# Patient Record
Sex: Female | Born: 2000
Health system: Southern US, Community
[De-identification: ages and names within clinical notes are randomized; demographics above are authoritative.]

## PROBLEM LIST (undated history)

## (undated) ENCOUNTER — Emergency Department: Admission: EM | Payer: Medicaid Other | Source: Home / Self Care

## (undated) DIAGNOSIS — F909 Attention-deficit hyperactivity disorder, unspecified type: Secondary | ICD-10-CM

## (undated) DIAGNOSIS — F419 Anxiety disorder, unspecified: Secondary | ICD-10-CM

## (undated) DIAGNOSIS — S02609A Fracture of mandible, unspecified, initial encounter for closed fracture: Secondary | ICD-10-CM

## (undated) DIAGNOSIS — D649 Anemia, unspecified: Secondary | ICD-10-CM

## (undated) DIAGNOSIS — F32A Depression, unspecified: Secondary | ICD-10-CM

## (undated) DIAGNOSIS — F209 Schizophrenia, unspecified: Secondary | ICD-10-CM

## (undated) HISTORY — DX: Schizophrenia, unspecified: F20.9

## (undated) HISTORY — DX: Attention-deficit hyperactivity disorder, unspecified type: F90.9

## (undated) HISTORY — DX: Depression, unspecified: F32.A

## (undated) HISTORY — DX: Anxiety disorder, unspecified: F41.9

## (undated) HISTORY — PX: NO PAST SURGERIES: SHX2092

---
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
## 2019-08-01 ENCOUNTER — Emergency Department (HOSPITAL_COMMUNITY): Payer: No Typology Code available for payment source

## 2019-08-01 ENCOUNTER — Other Ambulatory Visit: Payer: Self-pay

## 2019-08-01 ENCOUNTER — Encounter (HOSPITAL_COMMUNITY): Payer: Self-pay | Admitting: Pharmacy Technician

## 2019-08-01 ENCOUNTER — Inpatient Hospital Stay (HOSPITAL_COMMUNITY)
Admission: EM | Admit: 2019-08-01 | Discharge: 2019-08-02 | DRG: 200 | Disposition: A | Discharge status: Home / Self Care | Payer: No Typology Code available for payment source | Attending: Physician Assistant | Admitting: Physician Assistant

## 2019-08-01 DIAGNOSIS — S0083XA Contusion of other part of head, initial encounter: Secondary | ICD-10-CM | POA: Diagnosis present

## 2019-08-01 DIAGNOSIS — F17210 Nicotine dependence, cigarettes, uncomplicated: Secondary | ICD-10-CM | POA: Diagnosis present

## 2019-08-01 DIAGNOSIS — R52 Pain, unspecified: Secondary | ICD-10-CM | POA: Diagnosis not present

## 2019-08-01 DIAGNOSIS — R000 Tachycardia, unspecified: Secondary | ICD-10-CM | POA: Diagnosis present

## 2019-08-01 DIAGNOSIS — M264 Malocclusion, unspecified: Secondary | ICD-10-CM | POA: Diagnosis present

## 2019-08-01 DIAGNOSIS — S270XXA Traumatic pneumothorax, initial encounter: Secondary | ICD-10-CM | POA: Diagnosis not present

## 2019-08-01 DIAGNOSIS — Z20828 Contact with and (suspected) exposure to other viral communicable diseases: Secondary | ICD-10-CM | POA: Diagnosis present

## 2019-08-01 DIAGNOSIS — S02609A Fracture of mandible, unspecified, initial encounter for closed fracture: Secondary | ICD-10-CM

## 2019-08-01 DIAGNOSIS — S02652A Fracture of angle of left mandible, initial encounter for closed fracture: Secondary | ICD-10-CM | POA: Diagnosis present

## 2019-08-01 DIAGNOSIS — S0081XA Abrasion of other part of head, initial encounter: Secondary | ICD-10-CM | POA: Diagnosis present

## 2019-08-01 DIAGNOSIS — J939 Pneumothorax, unspecified: Secondary | ICD-10-CM

## 2019-08-01 DIAGNOSIS — Y92410 Unspecified street and highway as the place of occurrence of the external cause: Secondary | ICD-10-CM

## 2019-08-01 LAB — COMPREHENSIVE METABOLIC PANEL
ALT: 125 U/L — ABNORMAL HIGH (ref 0–44)
AST: 192 U/L — ABNORMAL HIGH (ref 15–41)
Albumin: 3.9 g/dL (ref 3.5–5.0)
Alkaline Phosphatase: 81 U/L (ref 38–126)
Anion gap: 12 (ref 5–15)
BUN: <5 mg/dL — ABNORMAL LOW (ref 6–20)
CO2: 23 mmol/L (ref 22–32)
Calcium: 8.9 mg/dL (ref 8.9–10.3)
Chloride: 102 mmol/L (ref 98–111)
Creatinine, Ser: 0.57 mg/dL (ref 0.44–1.00)
GFR calc Af Amer: >60 mL/min (ref >60)
GFR calc non Af Amer: >60 mL/min (ref >60)
Glucose, Bld: 115 mg/dL — ABNORMAL HIGH (ref 70–99)
Potassium: 3.4 mmol/L — ABNORMAL LOW (ref 3.5–5.1)
Sodium: 137 mmol/L (ref 135–145)
Total Bilirubin: 0.4 mg/dL (ref 0.3–1.2)
Total Protein: 6.9 g/dL (ref 6.5–8.1)

## 2019-08-01 LAB — I-STAT BETA HCG BLOOD, ED (MC, WL, AP ONLY): I-stat hCG, quantitative: <5 m[IU]/mL (ref <5)

## 2019-08-01 LAB — I-STAT CHEM 8, ED
BUN: <3 mg/dL — ABNORMAL LOW (ref 6–20)
Calcium, Ion: 1.1 mmol/L — ABNORMAL LOW (ref 1.15–1.40)
Chloride: 100 mmol/L (ref 98–111)
Creatinine, Ser: 0.6 mg/dL (ref 0.44–1.00)
Glucose, Bld: 110 mg/dL — ABNORMAL HIGH (ref 70–99)
HCT: 40 % (ref 36.0–46.0)
Hemoglobin: 13.6 g/dL (ref 12.0–15.0)
Potassium: 3.5 mmol/L (ref 3.5–5.1)
Sodium: 140 mmol/L (ref 135–145)
TCO2: 26 mmol/L (ref 22–32)

## 2019-08-01 LAB — PROTIME-INR
INR: 1 (ref 0.8–1.2)
Prothrombin Time: 13.4 seconds (ref 11.4–15.2)

## 2019-08-01 LAB — SAMPLE TO BLOOD BANK

## 2019-08-01 LAB — SARS CORONAVIRUS 2 BY RT PCR (HOSPITAL ORDER, PERFORMED IN ~~LOC~~ HOSPITAL LAB): SARS Coronavirus 2: NEGATIVE

## 2019-08-01 LAB — CBC
HCT: 40.1 % (ref 36.0–46.0)
Hemoglobin: 13.2 g/dL (ref 12.0–15.0)
MCH: 30.9 pg (ref 26.0–34.0)
MCHC: 32.9 g/dL (ref 30.0–36.0)
MCV: 93.9 fL (ref 80.0–100.0)
Platelets: 367 10*3/uL (ref 150–400)
RBC: 4.27 MIL/uL (ref 3.87–5.11)
RDW: 11.7 % (ref 11.5–15.5)
WBC: 12 10*3/uL — ABNORMAL HIGH (ref 4.0–10.5)
nRBC: 0 % (ref 0.0–0.2)

## 2019-08-01 LAB — LACTIC ACID, PLASMA: Lactic Acid, Venous: 2 mmol/L (ref 0.5–1.9)

## 2019-08-01 LAB — ETHANOL: Alcohol, Ethyl (B): <10 mg/dL (ref <10)

## 2019-08-01 MED ORDER — IOHEXOL 350 MG/ML SOLN
100.0000 mL | Freq: Once | INTRAVENOUS | Status: AC | PRN
  Administered 2019-08-01: 100 mL via INTRAVENOUS

## 2019-08-01 MED ORDER — TETANUS-DIPHTH-ACELL PERTUSSIS 5-2.5-18.5 LF-MCG/0.5 IM SUSP
0.5000 mL | Freq: Once | INTRAMUSCULAR | Status: AC
  Administered 2019-08-01: 0.5 mL via INTRAMUSCULAR
  Filled 2019-08-01: qty 0.5

## 2019-08-01 MED ORDER — FENTANYL CITRATE (PF) 100 MCG/2ML IJ SOLN
50.0000 ug | Freq: Once | INTRAMUSCULAR | Status: AC
  Administered 2019-08-01: 50 ug via INTRAVENOUS
  Filled 2019-08-01: qty 2

## 2019-08-01 MED ORDER — LACTATED RINGERS IV BOLUS
1000.0000 mL | Freq: Once | INTRAVENOUS | Status: AC
  Administered 2019-08-02: 1000 mL via INTRAVENOUS

## 2019-08-01 NOTE — ED Notes (Signed)
When pt gets a chance her fiances aunt, Harlow Mares, would like her to call [phone]
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
## 2019-08-01 NOTE — ED Notes (Signed)
Pt to CT
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
## 2019-08-01 NOTE — ED Notes (Signed)
Multiple facial piercings removed and placed in denture cup prior to going to CT

## 2019-08-02 ENCOUNTER — Encounter (HOSPITAL_COMMUNITY): Payer: Self-pay | Admitting: Emergency Medicine

## 2019-08-02 ENCOUNTER — Inpatient Hospital Stay (HOSPITAL_COMMUNITY): Payer: No Typology Code available for payment source

## 2019-08-02 DIAGNOSIS — S0081XA Abrasion of other part of head, initial encounter: Secondary | ICD-10-CM | POA: Diagnosis present

## 2019-08-02 DIAGNOSIS — M264 Malocclusion, unspecified: Secondary | ICD-10-CM | POA: Diagnosis present

## 2019-08-02 DIAGNOSIS — S270XXA Traumatic pneumothorax, initial encounter: Secondary | ICD-10-CM | POA: Diagnosis present

## 2019-08-02 DIAGNOSIS — Z20828 Contact with and (suspected) exposure to other viral communicable diseases: Secondary | ICD-10-CM | POA: Diagnosis present

## 2019-08-02 DIAGNOSIS — Y92410 Unspecified street and highway as the place of occurrence of the external cause: Secondary | ICD-10-CM | POA: Diagnosis not present

## 2019-08-02 DIAGNOSIS — S0083XA Contusion of other part of head, initial encounter: Secondary | ICD-10-CM | POA: Diagnosis present

## 2019-08-02 DIAGNOSIS — S02652A Fracture of angle of left mandible, initial encounter for closed fracture: Secondary | ICD-10-CM | POA: Diagnosis present

## 2019-08-02 DIAGNOSIS — R52 Pain, unspecified: Secondary | ICD-10-CM | POA: Diagnosis present

## 2019-08-02 DIAGNOSIS — F17210 Nicotine dependence, cigarettes, uncomplicated: Secondary | ICD-10-CM | POA: Diagnosis present

## 2019-08-02 DIAGNOSIS — R000 Tachycardia, unspecified: Secondary | ICD-10-CM | POA: Diagnosis present

## 2019-08-02 LAB — CBC
HCT: 37.1 % (ref 36.0–46.0)
Hemoglobin: 12.6 g/dL (ref 12.0–15.0)
MCH: 31.7 pg (ref 26.0–34.0)
MCHC: 34 g/dL (ref 30.0–36.0)
MCV: 93.2 fL (ref 80.0–100.0)
Platelets: 267 10*3/uL (ref 150–400)
RBC: 3.98 MIL/uL (ref 3.87–5.11)
RDW: 11.7 % (ref 11.5–15.5)
WBC: 11.3 10*3/uL — ABNORMAL HIGH (ref 4.0–10.5)
nRBC: 0 % (ref 0.0–0.2)

## 2019-08-02 LAB — BASIC METABOLIC PANEL
Anion gap: 11 (ref 5–15)
BUN: 5 mg/dL — ABNORMAL LOW (ref 6–20)
CO2: 25 mmol/L (ref 22–32)
Calcium: 8.8 mg/dL — ABNORMAL LOW (ref 8.9–10.3)
Chloride: 103 mmol/L (ref 98–111)
Creatinine, Ser: 0.67 mg/dL (ref 0.44–1.00)
GFR calc Af Amer: >60 mL/min (ref >60)
GFR calc non Af Amer: >60 mL/min (ref >60)
Glucose, Bld: 103 mg/dL — ABNORMAL HIGH (ref 70–99)
Potassium: 4 mmol/L (ref 3.5–5.1)
Sodium: 139 mmol/L (ref 135–145)

## 2019-08-02 LAB — URINALYSIS, ROUTINE W REFLEX MICROSCOPIC
Bacteria, UA: NONE SEEN
Bilirubin Urine: NEGATIVE
Glucose, UA: NEGATIVE mg/dL
Ketones, ur: NEGATIVE mg/dL
Leukocytes,Ua: NEGATIVE
Nitrite: NEGATIVE
Protein, ur: NEGATIVE mg/dL
Specific Gravity, Urine: 1.033 — ABNORMAL HIGH (ref 1.005–1.030)
pH: 7 (ref 5.0–8.0)

## 2019-08-02 LAB — CDS SEROLOGY

## 2019-08-02 LAB — HIV ANTIBODY (ROUTINE TESTING W REFLEX): HIV Screen 4th Generation wRfx: NONREACTIVE

## 2019-08-02 MED ORDER — ACETAMINOPHEN 325 MG PO TABS: 650.0000 mg | ORAL_TABLET | Freq: Four times a day (QID) | ORAL | Status: DC | PRN

## 2019-08-02 MED ORDER — OXYCODONE HCL 5 MG PO TABS: 5.0000 mg | ORAL_TABLET | Freq: Four times a day (QID) | ORAL | 0 refills | Status: DC | PRN

## 2019-08-02 MED ORDER — ONDANSETRON 4 MG PO TBDP: 4.0000 mg | ORAL_TABLET | Freq: Four times a day (QID) | ORAL | 0 refills | Status: DC | PRN

## 2019-08-02 MED ORDER — OXYCODONE HCL 5 MG PO TABS
5.0000 mg | ORAL_TABLET | ORAL | Status: DC | PRN
  Administered 2019-08-02 (×2): 5 mg via ORAL
  Filled 2019-08-02 (×2): qty 1

## 2019-08-02 MED ORDER — ONDANSETRON 4 MG PO TBDP: 4.0000 mg | ORAL_TABLET | Freq: Four times a day (QID) | ORAL | Status: DC | PRN

## 2019-08-02 MED ORDER — DEXTROSE-NACL 5-0.9 % IV SOLN
INTRAVENOUS | Status: DC
  Administered 2019-08-02: 09:00:00 via INTRAVENOUS

## 2019-08-02 MED ORDER — ENOXAPARIN SODIUM 40 MG/0.4ML ~~LOC~~ SOLN: 40.0000 mg | Freq: Every day | SUBCUTANEOUS | Status: DC

## 2019-08-02 MED ORDER — HYDROMORPHONE HCL 1 MG/ML IJ SOLN: 0.5000 mg | INTRAMUSCULAR | Status: DC | PRN

## 2019-08-02 MED ORDER — HYDROMORPHONE HCL 1 MG/ML IJ SOLN
1.0000 mg | INTRAMUSCULAR | Status: DC | PRN
  Administered 2019-08-02: 1 mg via INTRAVENOUS
  Filled 2019-08-02: qty 1

## 2019-08-02 MED ORDER — ONDANSETRON HCL 4 MG/2ML IJ SOLN
4.0000 mg | Freq: Four times a day (QID) | INTRAMUSCULAR | Status: DC | PRN
  Administered 2019-08-02: 4 mg via INTRAVENOUS
  Filled 2019-08-02: qty 2

## 2019-08-02 MED FILL — oxyCODONE HCL 5 MG TABS: 5 | 2 days supply | Qty: 15 | Fill #0

## 2019-08-02 MED FILL — ONDANSETRON ODT 4 MG TABLET: 4 | 4 days supply | Qty: 15 | Fill #0

## 2019-08-02 NOTE — Progress Notes (Signed)
Rn went over discharge instructions with the patient and she stated understanding. IV has been removed pt ride on the way.precriptions dropped off by [facility] pharmacy

## 2019-08-02 NOTE — Discharge Instructions (Signed)
Full Liquid Diet A full liquid diet refers to fluids and foods that are liquid or will become liquid at room temperature. This diet should only be used for a short period of time to help you recover from illness or surgery. Your health care provider or dietitian will help you determine when it is safe to eat regular foods. What are tips for following this plan?     Reading food labels  Check food labels of nutrition shakes for the amount of protein. Look for nutrition shakes that have at least 8-10 grams of protein in each serving.  Look for drinks, such as milks and juices, that are "fortified" or "enriched." This means that vitamins and minerals have been added. Shopping  Buy premade nutritional shakes to keep on hand.  To vary your choices, buy different flavors of milks and shakes. Meal planning  Choose flavors and foods that you enjoy.  To make sure you get enough energy from food (calories): ? Eat 3 full liquid meals each day. Have a liquid snack between each meal. ? Drink 6-8 ounces (177-237 ml) of a nutrition supplement shake with meals or as snacks. ? Add protein powder, powdered milk, milk, or yogurt to shakes to increase the amount of protein.  Drink at least one serving a day of citrus fruit juice or fruit juice that has vitamin C added. General guidelines  Before starting the full-liquid diet, check with your health care provider to know what foods you should avoid. These may include full-fat or high-fiber liquids.  You may have any liquid or food that becomes a liquid at room temperature. The food is considered a liquid if it can be poured off a spoon at room temperature.  Do not drink alcohol unless approved by your health care provider.  This diet gives you most of the nutrients that you need for energy, but you may not get enough of certain vitamins, minerals, and fiber. Make sure to talk to your health care provider or dietitian about: ? How many calories you need  to eat get day. ? How much fluid you should have each day. ? Taking a multivitamin or a nutritional supplement. What foods are allowed? The items listed may not be a complete list. Talk with your dietitian about what dietary choices are best for you. Grains Thin hot cereal, such as cream of wheat. Soft-cooked pasta or rice pured in soup. Vegetables Pulp-free tomato or vegetable juice. Vegetables pured in soup. Fruits Fruit juice without pulp. Strained fruit pures (seeds and skins removed). Meats and other protein foods Beef, chicken, and fish broths. Powdered protein supplements. Dairy Milk and milk-based beverages, including milk shakes and instant breakfast mixes. Smooth yogurt. Pured cottage cheese. Beverages Water. Coffee and tea (caffeinated or decaffeinated). Cocoa. Liquid nutritional supplements. Soft drinks. Nondairy milks, such as almond, coconut, rice, or soy milk. Fats and oils Melted margarine and butter. Cream. Canola, almond, avocado, corn, grapeseed, sunflower, and sesame oils. Gravy. Sweets and desserts Custard. Pudding. Flavored gelatin. Smooth ice cream (without nuts or candy pieces). Sherbet. Popsicles. [place] ice. Pudding pops. Seasoning and other foods Salt and pepper. Spices. Cocoa powder. Vinegar. Ketchup. Yellow mustard. Smooth sauces, such as Hollandaise, cheese sauce, or white sauce. Soy sauce. Cream soups. Strained soups. Syrup. Honey. Jelly (without fruit pieces). What foods are not allowed? The items listed may not be a complete list. Talk with your dietitian about what dietary choices are best for you. Grains Whole grains. Pasta. Rice. Cold cereal. Bread. Crackers.  Vegetables All whole fresh, frozen, or canned vegetables. Fruits All whole fresh, frozen, or canned fruits. Meats and other protein foods All cuts of meat, poultry, and fish. Eggs. Tofu and soy protein. Nuts and nut butters. Lunch meat. Sausage. Dairy Hard cheese. Yogurt with fruit  chunks. Fats and oils Coconut oil. Palm oil. Lard. Cold butter. Sweets and desserts Ice cream or other frozen desserts that have any solids in them or on top, such as nuts, chocolate chips, and pieces of cookies. Cakes. Cookies. Candy. Seasoning and other foods Stone-ground mustards. Soups with chunks or pieces. Summary  A full liquid diet refers to fluids and foods that are liquid or will become liquid at room temperature.  This diet should only be used for a short period of time to help you recover from illness or surgery. Ask your health care provider or dietitian when it is safe for you to eat regular foods.  To make sure you get enough calories and nutrients, eat 3 meals each day with snacks between. Drink premade nutrition supplement shakes or add protein powder to homemade shakes. Take a vitamin and mineral supplement as told by your health care provider. This information is not intended to replace advice given to you by your health care provider. Make sure you discuss any questions you have with your health care provider. Document Released: 09/23/2005 Document Revised: 12/20/2017 Document Reviewed: 11/06/2016 Elsevier Patient Education  2020 [employer]   Pneumothorax A pneumothorax is commonly called a collapsed lung. It is a condition in which air leaks from a lung and builds up between the thin layer of tissue that covers the lungs (visceral pleura) and the interior wall of the chest cavity (parietal pleura). The air gets trapped outside the lung, between the lung and the chest wall (pleural space). The air takes up space and prevents the lung from fully expanding. This condition sometimes occurs suddenly with no apparent cause. The buildup of air may be small or large. A small pneumothorax may go away on its own. A large pneumothorax will require treatment and hospitalization. What are the causes? This condition may be caused by:  Trauma and injury to the chest  wall.  Surgery and other medical procedures.  A complication of an underlying lung problem, especially chronic obstructive pulmonary disease (COPD) or emphysema. Sometimes the cause of this condition is not known. What increases the risk? You are more likely to develop this condition if:  You have an underlying lung problem.  You smoke.  You are 7820-20 years old, female, tall, and underweight.  You have a personal or family history of pneumothorax.  You have an eating disorder (anorexia nervosa). This condition can also happen quickly, even in people with no history of lung problems. What are the signs or symptoms? Sometimes a pneumothorax will have no symptoms. When symptoms are present, they can include:  Chest pain.  Shortness of breath.  Increased rate of breathing.  Bluish color to your lips or skin (cyanosis). How is this diagnosed? This condition may be diagnosed by:  A medical history and physical exam.  A chest X-ray, chest CT scan, or ultrasound. How is this treated? Treatment depends on how severe your condition is. The goal of treatment is to remove the extra air and allow your lung to expand back to its normal size.  For a small pneumothorax: ? No treatment may be needed. ? Extra oxygen is sometimes used to make it go away more quickly.  For a large  pneumothorax or a pneumothorax that is causing symptoms, a procedure is done to drain the air from your lungs. To do this, a health care provider may use: ? A needle with a syringe. This is used to suck air from a pleural space where no additional leakage is taking place. ? A chest tube. This is used to suck air where there is ongoing leakage into the pleural space. The chest tube may need to remain in place for several days until the air leak has healed.  In more severe cases, surgery may be needed to repair the damage that is causing the leak.  If you have multiple pneumothorax episodes or have an air leak that  will not heal, a procedure called a pleurodesis may be done. A medicine is placed in the pleural space to irritate the tissues around the lung so that the lung will stick to the chest wall, seal any leaks, and stop any buildup of air in that space. If you have an underlying lung problem, severe symptoms, or a large pneumothorax you will usually need to stay in the hospital. Follow these instructions at home: Lifestyle  Do not use any products that contain nicotine or tobacco, such as cigarettes and e-cigarettes. These are major risk factors in pneumothorax. If you need help quitting, ask your health care provider.  Do not lift anything that is heavier than 10 lb (4.5 kg), or the limit that your health care provider tells you, until he or she says that it is safe.  Avoid activities that take a lot of effort (strenuous) for as long as told by your health care provider.  Return to your normal activities as told by your health care provider. Ask your health care provider what activities are safe for you.  Do not fly in an airplane or scuba dive until your health care provider says it is okay. General instructions  Take over-the-counter and prescription medicines only as told by your health care provider.  If a cough or pain makes it difficult for you to sleep at night, try sleeping in a semi-upright position in a recliner or by using 2 or 3 pillows.  If you had a chest tube and it was removed, ask your health care provider when you can remove the bandage (dressing). While the dressing is in place, do not allow it to get wet.  Keep all follow-up visits as told by your health care provider. This is important. Contact a health care provider if:  You cough up thick mucus (sputum) that is yellow or green in color.  You were treated with a chest tube, and you have redness, increasing pain, or discharge at the site where it was placed. Get help right away if:  You have increasing chest pain or  shortness of breath.  You have a cough that will not go away.  You begin coughing up blood.  You have pain that is getting worse or is not controlled with medicines.  The site where your chest tube was located opens up.  You feel air coming out of the site where the chest tube was placed.  You have a fever or persistent symptoms for more than 2-3 days.  You have a fever and your symptoms suddenly get worse. These symptoms may represent a serious problem that is an emergency. Do not wait to see if the symptoms will go away. Get medical help right away. Call your local emergency services (911 in the [place] Do not drive  yourself to the hospital. Summary  A pneumothorax, commonly called a collapsed lung, is a condition in which air leaks from a lung and gets trapped between the lung and the chest wall (pleural space).  The buildup of air may be small or large. A small pneumothorax may go away on its own. A large pneumothorax will require treatment and hospitalization.  Treatment for this condition depends on how severe the pneumothorax is. The goal of treatment is to remove the extra air and allow the lung to expand back to its normal size. This information is not intended to replace advice given to you by your health care provider. Make sure you discuss any questions you have with your health care provider. Document Released: 09/23/2005 Document Revised: 09/05/2017 Document Reviewed: 09/01/2017 Elsevier Patient Education  2020 [employer].
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
## 2019-08-02 NOTE — ED Notes (Signed)
Report given to [facility].

## 2019-08-02 NOTE — Consult Note (Signed)
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 optic nerve sheath complexes. Normal caliber of the superior ophthalmic veins. Sinuses: Paranasal sinuses and mastoid air cells are predominantly clear. Middle ear cavities and external auditory canals are clear. Ossicular chains are normal configuration. Soft tissues: Much of the swelling and radiopaque debris is seen within the left frontal and supraorbital soft tissues. There is an additional 5 mm linear radiodensity seen within the soft tissues superficial to the left frontal process of the zygoma. Soft tissue swelling and thickening adjacent to the left mandibular fracture. CT CERVICAL SPINE FINDINGS Alignment: Preservation of the normal cervical lordosis without traumatic listhesis. No abnormal facet widening. Normal alignment of the craniocervical and atlantoaxial articulations. Skull base and vertebrae: No acute fracture. No primary bone lesion or focal pathologic process. Soft tissues and spinal canal: No pre or paravertebral fluid or swelling. No visible canal hematoma.  Disc levels: No significant central canal or foraminal stenosis identified within the imaged levels of the spine. Upper chest: Left apical pneumothorax. No acute abnormality of the right apex. Other: None IMPRESSION: Swelling, laceration and debris within the left frontal  scalp and supraorbital soft tissues. Additional linear radiodensity in the left malar the soft tissues anterior to the frontal process of the left zygoma. Nondisplaced oblique fracture through the angle of the left mandible extending through the socket of the left second mandibular molar and traversing the mandibular canal. Correlate with clinical assessment of the left mental nerve. No calvarial fracture or acute intracranial abnormality. No cervical spine fracture or traumatic listhesis. Small left apical pneumothorax. Extensive periodontal disease, correlate with dental exam. Electronically Signed   By: Kreg Shropshire M.D.   On: 08/01/2019 22:37    AVW:[id number] except as listed in admit H&P  Blood pressure 102/80, pulse 91, temperature 98.2 F (36.8 C), temperature source Oral, resp. rate 17, height  (1.6 m), weight 54.4 kg, SpO2 99 %.  PHYSICAL EXAM: Overall appearance:  Healthy appearing, in no distress, speaking and breathing normally. Head:  Normocephalic, atraumatic. Ears: External ears look healthy. Nose: External nose is healthy in appearance. Internal nasal exam free of any lesions or obstruction. Oral Cavity/Pharynx:  There are no mucosal lesions or masses identified.  Dentition in very poor shape.  Multiple missing teeth and multiple cavities and decay present.  Difficult to assess occlusion based on the shape of the dentition.  She is tender along the left and mandibular angle.  There is mild swelling in the area.  There is no obvious crepitance or step-off of the mandible itself. Larynx/Hypopharynx: Deferred Neuro:  No identifiable neurologic deficits. Neck: No palpable neck masses.  Studies Reviewed:  Maxillofacial CT reviewed.  Procedures: none   Assessment/Plan: Nondisplaced left mandibular angle fracture.  Given the symptomatic malocclusion and pain recommend maxillomandibular fixation.  I will schedule her in the next day or 2.  In the meantime she can stay on a liquid diet.  Serena Colonel 08/02/2019, 7:33 AM

## 2019-08-03 ENCOUNTER — Encounter (HOSPITAL_COMMUNITY): Payer: Self-pay | Admitting: Emergency Medicine

## 2019-08-03 NOTE — Progress Notes (Signed)
Attempted to call pt for pre-op call. The number listed for pt, the person who answered it stated that they do not know her. 2 other numbers listed for possible family/friend were not working. Father's number went to voicemail. I left pre-op instructions on his phone. Instructed him to have pt arrive at 7:30 for a 10:30 surgery just in case she needs a Covid test done. She had one on the 25th, but not sure if pt has quarantined since being discharged yesterday.   I spoke with Dr. Smith Robert, Anesthesiologist to make him aware of pt's elevated AST and ALT. He stated that should not be an issue.

## 2019-08-04 ENCOUNTER — Encounter (HOSPITAL_COMMUNITY): Payer: Self-pay | Admitting: Certified Registered"

## 2019-08-04 ENCOUNTER — Inpatient Hospital Stay (HOSPITAL_COMMUNITY): Admission: RE | Admit: 2019-08-04 | Payer: Self-pay | Source: Home / Self Care | Admitting: Otolaryngology

## 2019-08-04 SURGERY — CLOSED REDUCTION, MANDIBLE, WITH ARCH BAR APPLICATION AND INTERMAXILLARY FIXATION
Anesthesia: General

## 2019-08-04 NOTE — Progress Notes (Signed)
Attempted to reach patient again but unsuccessful.  Left message for Dr. Constance Holster and Anderson Malta, his assistant.  Notified OR desk, OR room, charge CRNA, and anesthesiologist that we have been unable to reach patient at this time.

## 2019-08-10 ENCOUNTER — Other Ambulatory Visit: Payer: Self-pay

## 2019-08-10 ENCOUNTER — Encounter (HOSPITAL_BASED_OUTPATIENT_CLINIC_OR_DEPARTMENT_OTHER): Payer: Self-pay | Admitting: *Deleted

## 2019-08-11 ENCOUNTER — Ambulatory Visit (HOSPITAL_BASED_OUTPATIENT_CLINIC_OR_DEPARTMENT_OTHER): Admission: RE | Admit: 2019-08-11 | Payer: Self-pay | Source: Home / Self Care | Admitting: Otolaryngology

## 2019-08-11 ENCOUNTER — Encounter (HOSPITAL_COMMUNITY): Payer: Self-pay | Admitting: Certified Registered"

## 2019-08-11 HISTORY — DX: Fracture of mandible, unspecified, initial encounter for closed fracture: S02.609A

## 2019-08-11 HISTORY — DX: Anemia, unspecified: D64.9

## 2019-08-11 SURGERY — CLOSED REDUCTION, MANDIBLE, WITH ARCH BAR APPLICATION AND INTERMAXILLARY FIXATION
Anesthesia: General | Laterality: Left

## 2019-08-11 MED ORDER — LIDOCAINE 2% (20 MG/ML) 5 ML SYRINGE
INTRAMUSCULAR | Status: AC
  Filled 2019-08-11: qty 5

## 2019-08-11 MED ORDER — ROCURONIUM BROMIDE 10 MG/ML (PF) SYRINGE
PREFILLED_SYRINGE | INTRAVENOUS | Status: AC
  Filled 2019-08-11: qty 10

## 2019-08-11 MED ORDER — PROPOFOL 10 MG/ML IV BOLUS
INTRAVENOUS | Status: AC
  Filled 2019-08-11: qty 20

## 2020-02-12 ENCOUNTER — Emergency Department
Admission: EM | Admit: 2020-02-12 | Discharge: 2020-02-13 | Disposition: A | Discharge status: Other Acute Inpatient Hospital | Payer: Medicaid Other | Attending: Emergency Medicine | Admitting: Emergency Medicine

## 2020-02-12 ENCOUNTER — Other Ambulatory Visit: Payer: Self-pay

## 2020-02-12 DIAGNOSIS — Z20822 Contact with and (suspected) exposure to covid-19: Secondary | ICD-10-CM | POA: Diagnosis not present

## 2020-02-12 DIAGNOSIS — Z79899 Other long term (current) drug therapy: Secondary | ICD-10-CM | POA: Insufficient documentation

## 2020-02-12 DIAGNOSIS — F17200 Nicotine dependence, unspecified, uncomplicated: Secondary | ICD-10-CM | POA: Insufficient documentation

## 2020-02-12 DIAGNOSIS — R443 Hallucinations, unspecified: Secondary | ICD-10-CM

## 2020-02-12 DIAGNOSIS — F209 Schizophrenia, unspecified: Secondary | ICD-10-CM | POA: Diagnosis not present

## 2020-02-12 LAB — RESPIRATORY PANEL BY RT PCR (FLU A&B, COVID)
Influenza A by PCR: NEGATIVE
Influenza B by PCR: NEGATIVE
SARS Coronavirus 2 by RT PCR: NEGATIVE

## 2020-02-12 LAB — COMPREHENSIVE METABOLIC PANEL
ALT: 16 U/L (ref 0–44)
AST: 22 U/L (ref 15–41)
Albumin: 4.5 g/dL (ref 3.5–5.0)
Alkaline Phosphatase: 87 U/L (ref 38–126)
Anion gap: 10 (ref 5–15)
BUN: 10 mg/dL (ref 6–20)
CO2: 26 mmol/L (ref 22–32)
Calcium: 9.4 mg/dL (ref 8.9–10.3)
Chloride: 104 mmol/L (ref 98–111)
Creatinine, Ser: 0.52 mg/dL (ref 0.44–1.00)
GFR calc Af Amer: >60 mL/min (ref >60)
GFR calc non Af Amer: >60 mL/min (ref >60)
Glucose, Bld: 93 mg/dL (ref 70–99)
Potassium: 4.5 mmol/L (ref 3.5–5.1)
Sodium: 140 mmol/L (ref 135–145)
Total Bilirubin: 0.5 mg/dL (ref 0.3–1.2)
Total Protein: 8.3 g/dL — ABNORMAL HIGH (ref 6.5–8.1)

## 2020-02-12 LAB — CBC
HCT: 39.3 % (ref 36.0–46.0)
Hemoglobin: 13.2 g/dL (ref 12.0–15.0)
MCH: 31.5 pg (ref 26.0–34.0)
MCHC: 33.6 g/dL (ref 30.0–36.0)
MCV: 93.8 fL (ref 80.0–100.0)
Platelets: 379 10*3/uL (ref 150–400)
RBC: 4.19 MIL/uL (ref 3.87–5.11)
RDW: 12 % (ref 11.5–15.5)
WBC: 10 10*3/uL (ref 4.0–10.5)
nRBC: 0 % (ref 0.0–0.2)

## 2020-02-12 LAB — ETHANOL: Alcohol, Ethyl (B): <10 mg/dL (ref <10)

## 2020-02-12 LAB — SALICYLATE LEVEL: Salicylate Lvl: <7 mg/dL — ABNORMAL LOW (ref 7.0–30.0)

## 2020-02-12 LAB — ACETAMINOPHEN LEVEL: Acetaminophen (Tylenol), Serum: <10 ug/mL — ABNORMAL LOW (ref 10–30)

## 2020-02-12 NOTE — ED Notes (Signed)
Pt dressed out The following items placed in one of one labeled bag: grey metal barbell navel ring, grey metal barbell earring, two black circular earrings, two grey plug earrings, two grey barbell nipple rings, one grey metal ring, one grey metal ring with purple stone, two black velcro bracelets, black bracelet with shell charms, black elastic hairband, rainbow dog collar, white and black sneakers, black socks, white sweatshirt, black t shirt, pink tank top, jeans, black bra, black panties, grey mask.
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
## 2020-02-12 NOTE — ED Triage Notes (Signed)
Pt states is seeing things and hearing things. Pt states history of schizophrenia. Pt appears in no acute distress in triage, denies SI HI.

## 2020-02-12 NOTE — ED Notes (Signed)
Pt. Transferred to BHU from ED to room 4 after screening for contraband. Report to include Situation, Background, Assessment and Recommendations from [facility]. Pt. Oriented to unit including Q15 minute rounds as well as the security cameras for their protection. Patient is alert and oriented, warm and dry in no acute distress. Patient denies SI, HI, but states that she hears "voices" without command and sees "bugs and people".   Pt. Encouraged to let me know if needs arise.

## 2020-02-12 NOTE — BH Assessment (Signed)
Assessment Note  Carol Flowers is an 19 y.o. female. Carol Flowers arrived to the ED by way of personal transportation by her mother.  She reports, "I have had schizophrenia since age 21 and I have tried multiple medications and I did not have to go to therapy anymore, I did not have the money to.  The last couple of days my schizophrenic episodes have been getting worse.   For the past years, I have been pretty good at telling the reality from the hallucinations.  Lately I have not been able to and I am seeing new things.  Now I can't tell the reality from the hallucination because I am seeing things that I see every day.  I am seeing my family members and I cannot tell if they are really there or if they are not."  She denied symptoms of depression.  She reports an increase in anxiety and reports, "bad social anxiety".  She reports an increase in her OCD symptoms.  She denied suicidal and homicidal ideation and intent.  She reports that she has been facing stress from trying to find a job and taking care of her family, "I take the weight of all my family's problems usually".  She denied the use of alcohol or drugs.  She reports a history of panic attacks.  She reports feeling responsible to take care of her family. She multiple family stressors.   Diagnosis: Schizophrenia  Past Medical History:  Past Medical History:  Diagnosis Date  . Anemia   . Mandible fracture [facility])     Past Surgical History:  Procedure Laterality Date  . NO PAST SURGERIES      Family History: No family history on file.  Social History:  reports that she has been smoking. She has been smoking about 0.25 packs per day. She has never used smokeless tobacco. She reports that she does not drink alcohol or use drugs.  Additional Social History:  Alcohol / Drug Use History of alcohol / drug use?: (No drug use for at least 3 years.)  CIWA: CIWA-Ar BP: 127/88 Pulse Rate: (!) 118 COWS:    Allergies: No Known  Allergies  Home Medications: (Not in a hospital admission)   OB/GYN Status:  No LMP recorded (lmp unknown). (Menstrual status: IUD).  General Assessment Data Location of Assessment: [facility] ED TTS Assessment: In system Is this a Tele or Face-to-Face Assessment?: Face-to-Face Is this an Initial Assessment or a Re-assessment for this encounter?: Initial Assessment Patient Accompanied by:: N/A Language Other than English: No Living Arrangements: Other (Comment)(Private residence) What gender do you identify as?: Female Marital status: Married Pregnancy Status: No Living Arrangements: Spouse/significant other Can pt return to current living arrangement?: Yes Admission Status: Voluntary Is patient capable of signing voluntary admission?: Yes Referral Source: Self/Family/Friend Insurance type: None  Medical Screening Exam [facility] Walk-in ONLY) Medical Exam completed: Yes  Crisis Care Plan Living Arrangements: Spouse/significant other Legal Guardian: Other:(Self) Name of Psychiatrist: None Name of Therapist: None  Education Status Is patient currently in school?: No Is the patient employed, unemployed or receiving disability?: Unemployed  Risk to self with the past 6 months Suicidal Ideation: No Has patient been a risk to self within the past 6 months prior to admission? : No Suicidal Intent: No Has patient had any suicidal intent within the past 6 months prior to admission? : No Is patient at risk for suicide?: No Suicidal Plan?: No Has patient had any suicidal plan within the past 6 months  prior to admission? : No Access to Means: No What has been your use of drugs/alcohol within the last 12 months?: Denied use for over 3 years Previous Attempts/Gestures: Yes How many times?: (Unsure -  attempts occurred around age 1) Other Self Harm Risks: Denied Triggers for Past Attempts: Unknown Intentional Self Injurious Behavior: None Family Suicide History: No Recent stressful life  event(s): Other (Comment), Financial Problems(Family stressors) Persecutory voices/beliefs?: No Depression: No Substance abuse history and/or treatment for substance abuse?: Yes Suicide prevention information given to non-admitted patients: Not applicable  Risk to Others within the past 6 months Homicidal Ideation: No Does patient have any lifetime risk of violence toward others beyond the six months prior to admission? : No Thoughts of Harm to Others: No Current Homicidal Intent: No Current Homicidal Plan: No Access to Homicidal Means: No Identified Victim: None identified History of harm to others?: No Assessment of Violence: On admission Does patient have access to weapons?: No Criminal Charges Pending?: No Does patient have a court date: No Is patient on probation?: No  Psychosis Hallucinations: Auditory, Visual Delusions: None noted  Mental Status Report Appearance/Hygiene: In scrubs Eye Contact: Fair Motor Activity: Unremarkable Speech: Logical/coherent Level of Consciousness: Alert Mood: Euthymic Affect: Appropriate to circumstance Anxiety Level: Minimal Thought Processes: Coherent Judgement: Unimpaired Orientation: Appropriate for developmental age Obsessive Compulsive Thoughts/Behaviors: None  Cognitive Functioning Concentration: Decreased Memory: Recent Intact Is patient IDD: No Insight: Fair Impulse Control: Good Appetite: Fair Have you had any weight changes? : No Change Sleep: Decreased(Poor sleep) Vegetative Symptoms: None  ADLScreening [facility]) Patient's cognitive ability adequate to safely complete daily activities?: Yes Patient able to express need for assistance with ADLs?: Yes Independently performs ADLs?: Yes (appropriate for developmental age)  Prior Inpatient Therapy Prior Inpatient Therapy: Yes Prior Therapy Dates: 2016 Prior Therapy Facilty/Provider(s): [facility] Reason for Treatment: Schizophrenia  Prior Outpatient  Therapy Prior Outpatient Therapy: Yes Prior Therapy Dates: 2016  Prior Therapy Facilty/Provider(s): [facility] Reason for Treatment: Schizophrenia Does patient have an ACCT team?: No Does patient have Intensive In-House Services?  : No Does patient have [facility] services? : No Does patient have P4CC services?: No  ADL Screening (condition at time of admission) Patient's cognitive ability adequate to safely complete daily activities?: Yes Is the patient deaf or have difficulty hearing?: No Does the patient have difficulty seeing, even when wearing glasses/contacts?: No Does the patient have difficulty concentrating, remembering, or making decisions?: No Patient able to express need for assistance with ADLs?: Yes Does the patient have difficulty dressing or bathing?: No Independently performs ADLs?: Yes (appropriate for developmental age) Does the patient have difficulty walking or climbing stairs?: No Weakness of Legs: None Weakness of Arms/Hands: None  Home Assistive Devices/Equipment Home Assistive Devices/Equipment: None    Abuse/Neglect Assessment (Assessment to be complete while patient is alone) Abuse/Neglect Assessment Can Be Completed: (Denied a history of abuse)                Disposition:  Disposition Initial Assessment Completed for this Encounter: Yes  On Site Evaluation by:   Reviewed with Physician:    Elmer Bales 02/12/2020 10:46 PM
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
## 2020-02-13 ENCOUNTER — Encounter: Payer: Self-pay | Admitting: Psychiatric/Mental Health

## 2020-02-13 ENCOUNTER — Inpatient Hospital Stay
Admission: RE | Admit: 2020-02-13 | Discharge: 2020-02-15 | DRG: 885 | Disposition: A | Discharge status: Home / Self Care | Payer: Medicaid Other | Source: Intra-hospital | Attending: Psychiatry | Admitting: Psychiatry

## 2020-02-13 DIAGNOSIS — F17210 Nicotine dependence, cigarettes, uncomplicated: Secondary | ICD-10-CM | POA: Diagnosis present

## 2020-02-13 DIAGNOSIS — F609 Personality disorder, unspecified: Secondary | ICD-10-CM | POA: Diagnosis present

## 2020-02-13 DIAGNOSIS — Z79899 Other long term (current) drug therapy: Secondary | ICD-10-CM | POA: Diagnosis not present

## 2020-02-13 DIAGNOSIS — F29 Unspecified psychosis not due to a substance or known physiological condition: Secondary | ICD-10-CM | POA: Diagnosis not present

## 2020-02-13 DIAGNOSIS — F329 Major depressive disorder, single episode, unspecified: Secondary | ICD-10-CM | POA: Diagnosis present

## 2020-02-13 DIAGNOSIS — F4310 Post-traumatic stress disorder, unspecified: Secondary | ICD-10-CM | POA: Diagnosis present

## 2020-02-13 DIAGNOSIS — Z915 Personal history of self-harm: Secondary | ICD-10-CM | POA: Diagnosis not present

## 2020-02-13 DIAGNOSIS — Z20822 Contact with and (suspected) exposure to covid-19: Secondary | ICD-10-CM | POA: Diagnosis present

## 2020-02-13 DIAGNOSIS — F064 Anxiety disorder due to known physiological condition: Secondary | ICD-10-CM | POA: Diagnosis present

## 2020-02-13 DIAGNOSIS — Z811 Family history of alcohol abuse and dependence: Secondary | ICD-10-CM | POA: Diagnosis not present

## 2020-02-13 DIAGNOSIS — F1290 Cannabis use, unspecified, uncomplicated: Secondary | ICD-10-CM | POA: Diagnosis present

## 2020-02-13 DIAGNOSIS — R45851 Suicidal ideations: Secondary | ICD-10-CM | POA: Diagnosis present

## 2020-02-13 DIAGNOSIS — F209 Schizophrenia, unspecified: Principal | ICD-10-CM | POA: Diagnosis present

## 2020-02-13 DIAGNOSIS — Z7289 Other problems related to lifestyle: Secondary | ICD-10-CM | POA: Diagnosis not present

## 2020-02-13 DIAGNOSIS — D649 Anemia, unspecified: Secondary | ICD-10-CM | POA: Diagnosis present

## 2020-02-13 DIAGNOSIS — Z888 Allergy status to other drugs, medicaments and biological substances status: Secondary | ICD-10-CM

## 2020-02-13 LAB — URINE DRUG SCREEN, QUALITATIVE (ARMC ONLY)
Amphetamines, Ur Screen: NOT DETECTED
Barbiturates, Ur Screen: NOT DETECTED
Benzodiazepine, Ur Scrn: POSITIVE — AB
Cannabinoid 50 Ng, Ur ~~LOC~~: NOT DETECTED
Cocaine Metabolite,Ur ~~LOC~~: NOT DETECTED
MDMA (Ecstasy)Ur Screen: NOT DETECTED
Methadone Scn, Ur: NOT DETECTED
Opiate, Ur Screen: NOT DETECTED
Phencyclidine (PCP) Ur S: NOT DETECTED
Tricyclic, Ur Screen: POSITIVE — AB

## 2020-02-13 LAB — POCT PREGNANCY, URINE: Preg Test, Ur: NEGATIVE

## 2020-02-13 MED ORDER — NICOTINE 14 MG/24HR TD PT24
14.0000 mg | MEDICATED_PATCH | Freq: Every day | TRANSDERMAL | Status: DC
  Administered 2020-02-13 – 2020-02-15 (×3): 14 mg via TRANSDERMAL
  Filled 2020-02-13 (×4): qty 1

## 2020-02-13 MED ORDER — ALUM & MAG HYDROXIDE-SIMETH 200-200-20 MG/5ML PO SUSP: 30.0000 mL | ORAL | Status: DC | PRN

## 2020-02-13 MED ORDER — MAGNESIUM HYDROXIDE 400 MG/5ML PO SUSP: 30.0000 mL | Freq: Every day | ORAL | Status: DC | PRN

## 2020-02-13 MED ORDER — TRAZODONE HCL 50 MG PO TABS
50.0000 mg | ORAL_TABLET | Freq: Every evening | ORAL | Status: DC | PRN
  Administered 2020-02-13 – 2020-02-14 (×2): 50 mg via ORAL
  Filled 2020-02-13 (×2): qty 1

## 2020-02-13 MED ORDER — ACETAMINOPHEN 325 MG PO TABS
650.0000 mg | ORAL_TABLET | Freq: Four times a day (QID) | ORAL | Status: DC | PRN
  Administered 2020-02-13 – 2020-02-15 (×4): 650 mg via ORAL
  Filled 2020-02-13 (×4): qty 2

## 2020-02-13 MED ORDER — PALIPERIDONE PALMITATE ER 234 MG/1.5ML IM SUSY
234.0000 mg | PREFILLED_SYRINGE | Freq: Once | INTRAMUSCULAR | Status: AC
  Administered 2020-02-13: 234 mg via INTRAMUSCULAR
  Filled 2020-02-13: qty 1.5

## 2020-02-13 NOTE — ED Notes (Signed)
Hourly rounding reveals patient sleeping in room. No complaints, stable, in no acute distress. Q15 minute rounds and monitoring via Security Cameras to continue. 
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
## 2020-02-13 NOTE — Progress Notes (Signed)
Admission Note:  The patient arrived to the unit, voluntarily and accompanied by security, requesting help with managing auditory and visual hallucinations.  A two RN skin search was conducted, revealing numerous scars from a history of self-injurious behaviors (no episodes within the past two years).  Documents were reviewed, belongings were searched, and a brief tour of the unit was provided.  Carol Flowers was calm, cooperative, and pleasant.  She endorsed a history of schizophrenia, social anxiety, alcohol abuse, and marijuana use.  Substance abuse within the past year was denied.  The patient does not take prescribed medication or receive outpatient therapy.  Vital signs and behaviors upon arrival were unremarkable.  15-minute safety checks initiated.

## 2020-02-13 NOTE — ED Notes (Signed)
Pt given meal tray and water 
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
## 2020-02-13 NOTE — ED Notes (Signed)
Patient has been accepted to [facility].  Patient assigned to room 310 Accepting physician is Haskell Flirt, NP.  Call report to [phone].  Representative was [place].   ER Staff is aware of it:  [facility] ER Secretary  Dr. Colon Branch, ER MD  Jillyn Hidden Patient's Nurse     ``````````````````````````````````````````````````````````````````````````````````````~.

## 2020-02-13 NOTE — Tx Team (Signed)
Initial Treatment Plan 02/13/2020 12:43 PM [name] HSJ:[id number]    PATIENT STRESSORS: Other: "Seeing things and hearing voices."   PATIENT STRENGTHS: Ability for insight Capable of independent living Communication skills   PATIENT IDENTIFIED PROBLEMS: Auditory and visual hallucinations.                     DISCHARGE CRITERIA:  Improved stabilization in mood, thinking, and/or behavior  PRELIMINARY DISCHARGE PLAN: Outpatient therapy  PATIENT/FAMILY INVOLVEMENT: This treatment plan has been presented to and reviewed with the patient, Carol Flowers, and/or family member.  The patient and family have been given the opportunity to ask questions and make suggestions.  Virgina Organ, RN 02/13/2020, 12:43 PM

## 2020-02-13 NOTE — BHH Group Notes (Signed)
BHH LCSW Group Therapy Note  02/13/2020    Type of Therapy and Topic:  Group Therapy:  A Hero Worthy of Support  Participation Level:  Active   Description of Group:  Patients in this group were introduced to the concept that additional supports including self-support are an essential part of recovery.  Matching needs with supports to help fulfill those needs was explained.  Establishing boundaries that can gradually be increased or decreased was described, with patients giving their own examples of establishing appropriate boundaries in their lives.  A song entitled "My Own Hero" was played and a group discussion ensued in which patients stated it inspired them to help themselves in order to succeed, because other people cannot achieve their goals such as sobriety or stability for them.  A song was played called "I Am Enough" which led to a discussion about being willing to believe we are worth the effort of being a self-support.   Therapeutic Goals: 1)  demonstrate the importance of being a key part of one's own support system 2)  discuss various available supports 3)  encourage patient to use music as part of their self-support and focus on goals 4)  elicit ideas from patients about supports that need to be added   Summary of Patient Progress:  The patient expressed that she feels this hospital stay will be beneficial for her because she needs to start on medicine due to her mind no longer being able to tell the difference between hallucinations and reality.  She participated when called upon directly but only listened otherwise.  There was an incident with another patient who was clearly psychotic and disruptive to the group.  Other patients, but not this particular patient expressed that they were becoming quite anxious as a result.  Several calming exercises were used, including hand breathing and a 5-4-3-2-1 grounding exercise.  The patient listened but did not actively participate in either  exercise.    Therapeutic Modalities:   Motivational Interviewing Activity  Lynnell Chad

## 2020-02-13 NOTE — ED Provider Notes (Signed)
Emergency Medicine Observation Re-evaluation Note  Carol Flowers is a 19 y.o. female, seen on rounds today.  Pt initially presented to the ED for complaints of Hallucinations Currently, the patient is resting.  Physical Exam  BP 127/88   Pulse (!) 118   Temp 98.2 F (36.8 C) (Oral)   Resp 16   Ht 5\' 2"  (1.575 m)   Wt 50.8 kg   LMP  (LMP Unknown)   SpO2 100%   BMI 20.49 kg/m    ED Course / MDM  I have reviewed the labs performed to date as well as medications administered while in observation.  Recent changes in the last 24 hours include - none. Plan  Current plan is for admission at [facility] IP psych. Patient is not under full IVC at this time.   [facility] MD 02/13/20 [id number]

## 2020-02-14 DIAGNOSIS — F29 Unspecified psychosis not due to a substance or known physiological condition: Secondary | ICD-10-CM

## 2020-02-14 LAB — TSH: TSH: 0.557 u[IU]/mL (ref 0.350–4.500)

## 2020-02-14 MED ORDER — HYDROXYZINE HCL 25 MG PO TABS
25.0000 mg | ORAL_TABLET | Freq: Three times a day (TID) | ORAL | Status: DC | PRN
  Administered 2020-02-15: 25 mg via ORAL
  Filled 2020-02-14: qty 1

## 2020-02-14 NOTE — Plan of Care (Signed)
Patient new to the unit tonight.   Problem: Education: Goal: Emotional status will improve Outcome: Not Progressing Goal: Mental status will improve Outcome: Not Progressing

## 2020-02-14 NOTE — Plan of Care (Signed)
Patient stated she was feeling better today, denies SI/HI/AVH, anxiety and pain  Problem: Education: Goal: Emotional status will improve Outcome: Progressing Goal: Mental status will improve Outcome: Progressing

## 2020-02-14 NOTE — Progress Notes (Signed)
Patient was on the phone upon arrival to the unit. Patient pleasant during assessment denying SI/HI/AVH. Patient endorses pain in her teeth, see MAR. Patient endorses anxiety and depression rating them 7/10. Patient given education, support and encouragement to be active in her treatment. Patient observed interacting appropriately with staff and peers on the unit by this Clinical research associate. Patient being monitored Q 15 minutes for safety per unit protocol. Pt remains safe on the unit.

## 2020-02-14 NOTE — Progress Notes (Signed)
Recreation Therapy Notes  Date: 02/14/2020   Time: 9:30 am  Location: Craft room   Behavioral response: Appropriate   Intervention Topic: Self-care    Discussion/Intervention:  Group content today was focused on Self-Care. The group defined self-care and some positive ways they care for themselves. Individuals expressed ways and reasons why they neglected any self-care in the past. Patients described ways to improve self-care in the future. The group explained what could happen if they did not do any self-care activities at all. The group participated in the intervention "self-care assessment" where they had a chance to discover some of their weaknesses and strengths in self- care. Patient came up with a self-care plan to improve themselves in the future.  Clinical Observations/Feedback:  Patient came to group and stated that she cares for herself by writing and drawing. She explained that she has neglected self-care in the past by putting other first. Participant expressed that self care is important when you are mentally and emotionally stressed.  Individual was social with peers and staff while participating in the intervention.   Ryelynn Guedea LRT/CTRS        Khadija Thier 02/14/2020 12:28 PM
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
## 2020-02-14 NOTE — Progress Notes (Signed)
Patient was on the phone upon arrival to the unit. Patient pleasant during assessment denying SI/HI/AVH. Patient endorses pain in her teeth, see MAR. Patient denies anxiety and depression stating, "I am feeling a lot better today and hope I can get out tomorrow." Patient given education, support and encouragement to be active in her treatment. Patient observed interacting appropriately with staff and peers on the unit by this Clinical research associate. Patient being monitored Q 15 minutes for safety per unit protocol. Pt remains safe on the unit. Patient observed interacting appropriately on the unit with staff and peers.

## 2020-02-14 NOTE — Plan of Care (Signed)
D- Patient alert and oriented. Patient presents in a pleasant mood on assessment stating that she slept "about as good as I can with insomnia". Patient had complaints of tooth pain, in which she requested stated that "my family has bad teeth". When asked about depression and anxiety, patient stated "surprisingly no" and that she has "occassional" anxiety, "that's usual for me though". Patient denies SI, HI, AVH at this time, reporting "not too much, they've been slowly calming down", regarding the voices. Patient had no stated goals for today.   A- Scheduled medications administered to patient, per MD orders. Support and encouragement provided.  Routine safety checks conducted every 15 minutes.  Patient informed to notify staff with problems or concerns.  R- No adverse drug reactions noted. Patient contracts for safety at this time. Patient compliant with medications and treatment plan. Patient receptive, calm, and cooperative. Patient interacts well with others on the unit.  Patient remains safe at this time.  Problem: Education: Goal: Knowledge of [facility] Education information/materials will improve Outcome: Progressing Goal: Emotional status will improve Outcome: Progressing Goal: Mental status will improve Outcome: Progressing Goal: Verbalization of understanding the information provided will improve Outcome: Progressing   Problem: Activity: Goal: Interest or engagement in activities will improve Outcome: Progressing Goal: Sleeping patterns will improve Outcome: Progressing   Problem: Coping: Goal: Ability to verbalize frustrations and anger appropriately will improve Outcome: Progressing Goal: Ability to demonstrate self-control will improve Outcome: Progressing   Problem: Health Behavior/Discharge Planning: Goal: Identification of resources available to assist in meeting health care needs will improve Outcome: Progressing Goal: Compliance with treatment plan for  underlying cause of condition will improve Outcome: Progressing   Problem: Physical Regulation: Goal: Ability to maintain clinical measurements within normal limits will improve Outcome: Progressing   Problem: Safety: Goal: Periods of time without injury will increase Outcome: Progressing   Problem: Activity: Goal: Will verbalize the importance of balancing activity with adequate rest periods Outcome: Progressing   Problem: Education: Goal: Will be free of psychotic symptoms Outcome: Progressing Goal: Knowledge of the prescribed therapeutic regimen will improve Outcome: Progressing   Problem: Coping: Goal: Coping ability will improve Outcome: Progressing Goal: Will verbalize feelings Outcome: Progressing   Problem: Health Behavior/Discharge Planning: Goal: Compliance with prescribed medication regimen will improve Outcome: Progressing   Problem: Nutritional: Goal: Ability to achieve adequate nutritional intake will improve Outcome: Progressing   Problem: Role Relationship: Goal: Ability to communicate needs accurately will improve Outcome: Progressing Goal: Ability to interact with others will improve Outcome: Progressing   Problem: Safety: Goal: Ability to redirect hostility and anger into socially appropriate behaviors will improve Outcome: Progressing Goal: Ability to remain free from injury will improve Outcome: Progressing   Problem: Self-Care: Goal: Ability to participate in self-care as condition permits will improve Outcome: Progressing   Problem: Self-Concept: Goal: Will verbalize positive feelings about self Outcome: Progressing

## 2020-02-14 NOTE — [facility] Group Notes (Signed)
LCSW Group Therapy Note   02/14/2020 3:10 PM  Type of Therapy and Topic:  Group Therapy:  Overcoming Obstacles   Participation Level:  Did Not Attend   Description of Group:    In this group patients will be encouraged to explore what they see as obstacles to their own wellness and recovery. They will be guided to discuss their thoughts, feelings, and behaviors related to these obstacles. The group will process together ways to cope with barriers, with attention given to specific choices patients can make. Each patient will be challenged to identify changes they are motivated to make in order to overcome their obstacles. This group will be process-oriented, with patients participating in exploration of their own experiences as well as giving and receiving support and challenge from other group members.   Therapeutic Goals: 1. Patient will identify personal and current obstacles as they relate to admission. 2. Patient will identify barriers that currently interfere with their wellness or overcoming obstacles.  3. Patient will identify feelings, thought process and behaviors related to these barriers. 4. Patient will identify two changes they are willing to make to overcome these obstacles:      Summary of Patient Progress X    Therapeutic Modalities:   Cognitive Behavioral Therapy Solution Focused Therapy Motivational Interviewing Relapse Prevention Therapy  Haiden Clucas, MSW, LCSW 02/14/2020 3:10 PM    

## 2020-02-14 NOTE — Tx Team (Addendum)
Interdisciplinary Treatment and Diagnostic Plan Update  02/14/2020 Time of Session: 9:00AM Carol Flowers MRN: [id number]  Principal Diagnosis: <principal problem not specified>  Secondary Diagnoses: Active Problems:   Schizophrenia ([place])   Current Medications:  Current Facility-Administered Medications  Medication Dose Route Frequency Provider Last Rate Last Admin  . acetaminophen (TYLENOL) tablet 650 mg  650 mg Oral Q6H PRN Deloria Lair, NP   650 mg at 02/14/20 1041  . alum & mag hydroxide-simeth (MAALOX/MYLANTA) 200-200-20 MG/5ML suspension 30 mL  30 mL Oral Q4H PRN Dixon, Rashaun M, NP      . magnesium hydroxide (MILK OF MAGNESIA) suspension 30 mL  30 mL Oral Daily PRN Dixon, Rashaun M, NP      . nicotine (NICODERM CQ - dosed in mg/24 hours) patch 14 mg  14 mg Transdermal Daily Larita Fife, MD   14 mg at 02/14/20 0850  . traZODone (DESYREL) tablet 50 mg  50 mg Oral QHS PRN Deloria Lair, NP   50 mg at 02/13/20 2043   PTA Medications: Medications Prior to Admission  Medication Sig Dispense Refill Last Dose  . levonorgestrel (MIRENA) 20 MCG/24HR IUD 1 each by Intrauterine route once.       Patient Stressors: Other: "Seeing things and hearing voices."  Patient Strengths: Ability for insight Capable of independent living Communication skills  Treatment Modalities: Medication Management, Group therapy, Case management,  1 to 1 session with clinician, Psychoeducation, Recreational therapy.   Physician Treatment Plan for Primary Diagnosis: <principal problem not specified> Long Term Goal(s): Improvement in symptoms so as ready for discharge Improvement in symptoms so as ready for discharge   Short Term Goals: Ability to identify changes in lifestyle to reduce recurrence of condition will improve Ability to verbalize feelings will improve Ability to demonstrate self-control will improve Ability to identify and develop effective coping behaviors will improve Ability  to maintain clinical measurements within normal limits will improve Compliance with prescribed medications will improve Ability to identify triggers associated with substance abuse/mental health issues will improve Ability to identify changes in lifestyle to reduce recurrence of condition will improve Ability to verbalize feelings will improve Ability to disclose and discuss suicidal ideas Ability to demonstrate self-control will improve Ability to identify and develop effective coping behaviors will improve Ability to maintain clinical measurements within normal limits will improve Compliance with prescribed medications will improve Ability to identify triggers associated with substance abuse/mental health issues will improve  Medication Management: Evaluate patient's response, side effects, and tolerance of medication regimen.  Therapeutic Interventions: 1 to 1 sessions, Unit Group sessions and Medication administration.  Evaluation of Outcomes: Not Met  Physician Treatment Plan for Secondary Diagnosis: Active Problems:   Schizophrenia ([place])  Long Term Goal(s): Improvement in symptoms so as ready for discharge Improvement in symptoms so as ready for discharge   Short Term Goals: Ability to identify changes in lifestyle to reduce recurrence of condition will improve Ability to verbalize feelings will improve Ability to demonstrate self-control will improve Ability to identify and develop effective coping behaviors will improve Ability to maintain clinical measurements within normal limits will improve Compliance with prescribed medications will improve Ability to identify triggers associated with substance abuse/mental health issues will improve Ability to identify changes in lifestyle to reduce recurrence of condition will improve Ability to verbalize feelings will improve Ability to disclose and discuss suicidal ideas Ability to demonstrate self-control will improve Ability to  identify and develop effective coping behaviors will improve Ability to maintain clinical  measurements within normal limits will improve Compliance with prescribed medications will improve Ability to identify triggers associated with substance abuse/mental health issues will improve     Medication Management: Evaluate patient's response, side effects, and tolerance of medication regimen.  Therapeutic Interventions: 1 to 1 sessions, Unit Group sessions and Medication administration.  Evaluation of Outcomes: Not Met   RN Treatment Plan for Primary Diagnosis: <principal problem not specified> Long Term Goal(s): Knowledge of disease and therapeutic regimen to maintain health will improve  Short Term Goals: Ability to participate in decision making will improve, Ability to verbalize feelings will improve, Ability to disclose and discuss suicidal ideas, Ability to identify and develop effective coping behaviors will improve and Compliance with prescribed medications will improve  Medication Management: RN will administer medications as ordered by provider, will assess and evaluate patient's response and provide education to patient for prescribed medication. RN will report any adverse and/or side effects to prescribing provider.  Therapeutic Interventions: 1 on 1 counseling sessions, Psychoeducation, Medication administration, Evaluate responses to treatment, Monitor vital signs and CBGs as ordered, Perform/monitor CIWA, COWS, AIMS and Fall Risk screenings as ordered, Perform wound care treatments as ordered.  Evaluation of Outcomes: Not Met   LCSW Treatment Plan for Primary Diagnosis: <principal problem not specified> Long Term Goal(s): Safe transition to appropriate next level of care at discharge, Engage patient in therapeutic group addressing interpersonal concerns.  Short Term Goals: Engage patient in aftercare planning with referrals and resources, Increase social support, Increase ability  to appropriately verbalize feelings, Increase emotional regulation, Facilitate acceptance of mental health diagnosis and concerns and Increase skills for wellness and recovery  Therapeutic Interventions: Assess for all discharge needs, 1 to 1 time with Social worker, Explore available resources and support systems, Assess for adequacy in community support network, Educate family and significant other(s) on suicide prevention, Complete Psychosocial Assessment, Interpersonal group therapy.  Evaluation of Outcomes: Not Met   Progress in Treatment: Attending groups: Yes. Participating in groups: Yes. Taking medication as prescribed: Yes. Toleration medication: Yes. Family/Significant other contact made: Yes, individual(s) contacted:  once permission is given.   Patient understands diagnosis: Yes. Discussing patient identified problems/goals with staff: Yes. Medical problems stabilized or resolved: Yes. Denies suicidal/homicidal ideation: Yes. Issues/concerns per patient self-inventory: No. Other: none  New problem(s) identified: No, Describe:  none  New Short Term/Long Term Goal(s): elimination of symptoms of psychosis, medication management for mood stabilization; elimination of SI thoughts; development of comprehensive mental wellness/sobriety plan.  Patient Goals:  "stop hallucinating and hearing things so much"  Discharge Plan or Barriers: Patient reports plans to return home.  Patient reports plans to begin aftercare treatment with [facility].  Reason for Continuation of Hospitalization: Anxiety Depression Medication stabilization Suicidal ideation  Estimated Length of Stay: 1-7 days  Recreational Therapy: Patient Stressors: N/A Patient Goal: Patient will engage in groups without prompting or encouragement from LRT x3 group sessions within 5 recreation therapy group sessions  Attendees: Patient: Carol Flowers 02/14/2020 10:55 AM  Physician: Dr. Mallie Darting, MD 02/14/2020 10:55 AM   Nursing: West Pugh, RN 02/14/2020 10:55 AM  RN Care Manager: 02/14/2020 10:55 AM  Social Worker: Assunta Curtis, LCSW 02/14/2020 10:55 AM  Recreational Therapist: Roanna Epley, Reather Converse, LRT 02/14/2020 10:55 AM  Other: Sanjuana Kava, LCSW 02/14/2020 10:55 AM  Other:  02/14/2020 10:55 AM  Other: 02/14/2020 10:55 AM    Scribe for Treatment Team: Rozann Lesches, LCSW 02/14/2020 10:55 AM

## 2020-02-14 NOTE — Progress Notes (Signed)
[facility] MD Progress Note  02/14/2020 11:06 AM Carol Flowers  MRN:  [id number] Subjective: Patient is a 19 year old female with a past psychiatric history significant for schizophrenia who presented to the [facility] on 02/13/2020 with worsening psychotic symptoms including hallucinations.  Objective: Patient is seen and examined.  Patient is a 19 year old female with a reported history of schizophrenia.  According to the notes she was diagnosed with schizophrenia since age 38 and had been on multiple medications in the past.  Her history also sounds like posttraumatic stress disorder and perhaps some borderline features as well.  The decision was made to give the patient a long-acting a injectable antipsychotic paliperidone.  She received that injection on 02/13/2020.  She stated she is doing well today.  She stated that she is not having auditory or visual hallucinations.  She denied any suicidal or homicidal ideation.  Her vital signs are stable, she is afebrile.  She slept 5.5 hours last night.  Currently she is not on any oral antipsychotic medications or antidepressants.  Review of her laboratories revealed essentially normal electrolytes, normal CBC, less than 10 salicylate and acetaminophen.  Negative pregnancy test.  Drug screen was positive for benzodiazepines as well as tricyclic's.  Principal Problem: <principal problem not specified> Diagnosis: Active Problems:   Schizophrenia ([place])  Total Time spent with patient: 30 minutes  Past Psychiatric History: See admission H&P  Past Medical History:  Past Medical History:  Diagnosis Date  . Anemia   . Mandible fracture [facility])     Past Surgical History:  Procedure Laterality Date  . NO PAST SURGERIES     Family History: History reviewed. No pertinent family history. Family Psychiatric  History: See admission H&P Social History:  Social History   Substance and Sexual Activity  Alcohol Use Never     Social History    Substance and Sexual Activity  Drug Use Never    Social History   Socioeconomic History  . Marital status: Single    Spouse name: Not on file  . Number of children: Not on file  . Years of education: Not on file  . Highest education level: Not on file  Occupational History  . Not on file  Tobacco Use  . Smoking status: Current Every Day Smoker    Packs/day: 0.25  . Smokeless tobacco: Never Used  Substance and Sexual Activity  . Alcohol use: Never  . Drug use: Never  . Sexual activity: Not on file  Other Topics Concern  . Not on file  Social History Narrative   ** Merged History Encounter **       Social Determinants of Health   Financial Resource Strain:   . Difficulty of Paying Living Expenses:   Food Insecurity:   . Worried About Charity fundraiser in the Last Year:   . Arboriculturist in the Last Year:   Transportation Needs:   . Film/video editor (Medical):   Marland Kitchen Lack of Transportation (Non-Medical):   Physical Activity:   . Days of Exercise per Week:   . Minutes of Exercise per Session:   Stress:   . Feeling of Stress :   Social Connections:   . Frequency of Communication with Friends and Family:   . Frequency of Social Gatherings with Friends and Family:   . Attends Religious Services:   . Active Member of Clubs or Organizations:   . Attends Archivist Meetings:   Marland Kitchen Marital Status:  Additional Social History:                         Sleep: Fair  Appetite:  Good  Current Medications: Current Facility-Administered Medications  Medication Dose Route Frequency Provider Last Rate Last Admin  . acetaminophen (TYLENOL) tablet 650 mg  650 mg Oral Q6H PRN Jearld Leschixon, Rashaun M, NP   650 mg at 02/14/20 1041  . alum & mag hydroxide-simeth (MAALOX/MYLANTA) 200-200-20 MG/5ML suspension 30 mL  30 mL Oral Q4H PRN Dixon, Rashaun M, NP      . magnesium hydroxide (MILK OF MAGNESIA) suspension 30 mL  30 mL Oral Daily PRN Dixon, Rashaun M, NP       . nicotine (NICODERM CQ - dosed in mg/24 hours) patch 14 mg  14 mg Transdermal Daily Thalia PartyPaliy, Alisa, MD   14 mg at 02/14/20 0850  . traZODone (DESYREL) tablet 50 mg  50 mg Oral QHS PRN Jearld Leschixon, Rashaun M, NP   50 mg at 02/13/20 2043    Lab Results:  Results for orders placed or performed during the hospital encounter of 02/12/20 (from the past 48 hour(s))  Comprehensive metabolic panel     Status: Abnormal   Collection Time: 02/12/20  8:36 PM  Result Value Ref Range   Sodium 140 135 - 145 mmol/L   Potassium 4.5 3.5 - 5.1 mmol/L   Chloride 104 98 - 111 mmol/L   CO2 26 22 - 32 mmol/L   Glucose, Bld 93 70 - 99 mg/dL    Comment: Glucose reference range applies only to samples taken after fasting for at least 8 hours.   BUN 10 6 - 20 mg/dL   Creatinine, Ser 9.140.52 0.44 - 1.00 mg/dL   Calcium 9.4 8.9 - 78.210.3 mg/dL   Total Protein 8.3 (H) 6.5 - 8.1 g/dL   Albumin 4.5 3.5 - 5.0 g/dL   AST 22 15 - 41 U/L   ALT 16 0 - 44 U/L   Alkaline Phosphatase 87 38 - 126 U/L   Total Bilirubin 0.5 0.3 - 1.2 mg/dL   GFR calc non Af Amer >60 >60 mL/min   GFR calc Af Amer >60 >60 mL/min   Anion gap 10 5 - 15    Comment: Performed at [facility], [address] [place], [place] [address]  Ethanol     Status: None   Collection Time: 02/12/20  8:36 PM  Result Value Ref Range   Alcohol, Ethyl (B) <10 <10 mg/dL    Comment: (NOTE) Lowest detectable limit for serum alcohol is 10 mg/dL. For medical purposes only. Performed at [facility], [address] [place], [place] [address]   Salicylate level     Status: Abnormal   Collection Time: 02/12/20  8:36 PM  Result Value Ref Range   Salicylate Lvl <7.0 (L) 7.0 - 30.0 mg/dL    Comment: Performed at [facility], [address] [place], [place] [address]  Acetaminophen level     Status: Abnormal   Collection Time: 02/12/20  8:36 PM  Result Value Ref Range   Acetaminophen (Tylenol), Serum <10 (L) 10 - 30 ug/mL    Comment:  (NOTE) Therapeutic concentrations vary significantly. A range of 10-30 ug/mL  may be an effective concentration for many patients. However, some  are best treated at concentrations outside of this range. Acetaminophen concentrations >150 ug/mL at 4 hours after ingestion  and >50 ug/mL at 12 hours after ingestion are often associated  with  toxic reactions. Performed at [facility], [address] [place], [place] [address]   cbc     Status: None   Collection Time: 02/12/20  8:36 PM  Result Value Ref Range   WBC 10.0 4.0 - 10.5 K/uL   RBC 4.19 3.87 - 5.11 MIL/uL   Hemoglobin 13.2 12.0 - 15.0 g/dL   HCT 84.1 66.0 - 63.0 %   MCV 93.8 80.0 - 100.0 fL   MCH 31.5 26.0 - 34.0 pg   MCHC 33.6 30.0 - 36.0 g/dL   RDW 16.0 10.9 - 32.3 %   Platelets 379 150 - 400 K/uL   nRBC 0.0 0.0 - 0.2 %    Comment: Performed at [facility], [address] [place], [place] [address]  Respiratory Panel by RT PCR (Flu A&B, Covid) - Nasopharyngeal Swab     Status: None   Collection Time: 02/12/20 10:58 PM   Specimen: Nasopharyngeal Swab  Result Value Ref Range   SARS Coronavirus 2 by RT PCR NEGATIVE NEGATIVE    Comment: (NOTE) SARS-CoV-2 target nucleic acids are NOT DETECTED. The SARS-CoV-2 RNA is generally detectable in upper respiratoy specimens during the acute phase of infection. The lowest concentration of SARS-CoV-2 viral copies this assay can detect is 131 copies/mL. A negative result does not preclude SARS-Cov-2 infection and should not be used as the sole basis for treatment or other patient management decisions. A negative result may occur with  improper specimen collection/handling, submission of specimen other than nasopharyngeal swab, presence of viral mutation(s) within the areas targeted by this assay, and inadequate number of viral copies (<131 copies/mL). A negative result must be combined with clinical observations, patient history, and epidemiological information.  The expected result is Negative. Fact Sheet for Patients:  https://www.moore.com/ Fact Sheet for Healthcare Providers:  https://www.young.biz/ This test is not yet ap proved or cleared by the [place] FDA and  has been authorized for detection and/or diagnosis of SARS-CoV-2 by FDA under an Emergency Use Authorization (EUA). This EUA will remain  in effect (meaning this test can be used) for the duration of the COVID-19 declaration under Section 564(b)(1) of the Act, 21 U.S.C. section 360bbb-3(b)(1), unless the authorization is terminated or revoked sooner.    Influenza A by PCR NEGATIVE NEGATIVE   Influenza B by PCR NEGATIVE NEGATIVE    Comment: (NOTE) The Xpert Xpress SARS-CoV-2/FLU/RSV assay is intended as an aid in  the diagnosis of influenza from Nasopharyngeal swab specimens and  should not be used as a sole basis for treatment. Nasal washings and  aspirates are unacceptable for Xpert Xpress SARS-CoV-2/FLU/RSV  testing. Fact Sheet for Patients: https://www.moore.com/ Fact Sheet for Healthcare Providers: https://www.young.biz/ This test is not yet approved or cleared by the [place] FDA and  has been authorized for detection and/or diagnosis of SARS-CoV-2 by  FDA under an Emergency Use Authorization (EUA). This EUA will remain  in effect (meaning this test can be used) for the duration of the  Covid-19 declaration under Section 564(b)(1) of the Act, 21  U.S.C. section 360bbb-3(b)(1), unless the authorization is  terminated or revoked. Performed at [facility], [address] [place], [place] [address]   Urine Drug Screen, Qualitative     Status: Abnormal   Collection Time: 02/13/20  8:23 AM  Result Value Ref Range   Tricyclic, Ur Screen POSITIVE (A) NONE DETECTED   Amphetamines, Ur Screen NONE DETECTED NONE DETECTED   MDMA (Ecstasy)Ur Screen NONE DETECTED NONE DETECTED  Cocaine Metabolite,Ur [place] NONE DETECTED NONE DETECTED   Opiate, Ur Screen NONE DETECTED NONE DETECTED   Phencyclidine (PCP) Ur S NONE DETECTED NONE DETECTED   Cannabinoid 50 Ng, Ur [place] NONE DETECTED NONE DETECTED   Barbiturates, Ur Screen NONE DETECTED NONE DETECTED   Benzodiazepine, Ur Scrn POSITIVE (A) NONE DETECTED   Methadone Scn, Ur NONE DETECTED NONE DETECTED    Comment: (NOTE) Tricyclics + metabolites, urine    Cutoff 1000 ng/mL Amphetamines + metabolites, urine  Cutoff 1000 ng/mL MDMA (Ecstasy), urine              Cutoff 500 ng/mL Cocaine Metabolite, urine          Cutoff 300 ng/mL Opiate + metabolites, urine        Cutoff 300 ng/mL Phencyclidine (PCP), urine         Cutoff 25 ng/mL Cannabinoid, urine                 Cutoff 50 ng/mL Barbiturates + metabolites, urine  Cutoff 200 ng/mL Benzodiazepine, urine              Cutoff 200 ng/mL Methadone, urine                   Cutoff 300 ng/mL The urine drug screen provides only a preliminary, unconfirmed analytical test result and should not be used for non-medical purposes. Clinical consideration and professional judgment should be applied to any positive drug screen result due to possible interfering substances. A more specific alternate chemical method must be used in order to obtain a confirmed analytical result. Gas chromatography / mass spectrometry (GC/MS) is the preferred confirmat ory method. Performed at [facility], [address] [place], [place] [address]   Pregnancy, urine POC     Status: None   Collection Time: 02/13/20  8:38 AM  Result Value Ref Range   Preg Test, Ur NEGATIVE NEGATIVE    Comment:        THE SENSITIVITY OF THIS METHODOLOGY IS >24 mIU/mL     Blood Alcohol level:  Lab Results  Component Value Date   ETH <10 02/12/2020   ETH <10 08/01/2019    Metabolic Disorder Labs: No results found for: HGBA1C, MPG No results found for: PROLACTIN No results found for: CHOL, TRIG, HDL, CHOLHDL,  VLDL, LDLCALC  Physical Findings: AIMS:  , ,  ,  ,    CIWA:    COWS:     Musculoskeletal: Strength & Muscle Tone: within normal limits Gait & Station: normal Patient leans: N/A  Psychiatric Specialty Exam: Physical Exam  Nursing note and vitals reviewed. Constitutional: She is oriented to person, place, and time. She appears well-developed and well-nourished.  HENT:  Head: Normocephalic and atraumatic.  Respiratory: Effort normal.  Neurological: She is alert and oriented to person, place, and time.    Review of Systems  Blood pressure 118/82, pulse (!) 101, temperature 98 F (36.7 C), temperature source Oral, resp. rate 18, height 5\' 3"  (1.6 m), weight 52 kg, SpO2 100 %.Body mass index is 20.31 kg/m.  General Appearance: Casual  Eye Contact:  Fair  Speech:  Normal Rate  Volume:  Normal  Mood:  Euthymic  Affect:  Congruent  Thought Process:  Coherent and Descriptions of Associations: Intact  Orientation:  Full (Time, Place, and Person)  Thought Content:  Logical  Suicidal Thoughts:  No  Homicidal Thoughts:  No  Memory:  Immediate;   Fair Recent;   Fair Remote;  Fair  Judgement:  Intact  Insight:  Fair  Psychomotor Activity:  Normal  Concentration:  Concentration: Fair and Attention Span: Fair  Recall:  [employer] of Knowledge:  Fair  Language:  Good  Akathisia:  Negative  Handed:  Right  AIMS (if indicated):     Assets:  Desire for Improvement Resilience  ADL's:  Intact  Cognition:  WNL  Sleep:  Number of Hours: 5.5     Treatment Plan Summary: Daily contact with patient to assess and evaluate symptoms and progress in treatment, Medication management and Plan : Patient is seen and examined.  Patient is a 19 year old female with the above-stated past psychiatric history who is seen in follow-up.   Diagnosis: #1 schizophrenia, #2 posttraumatic stress disorder  Patient is seen in follow-up.  She seems to be doing much better.  I think we need some more  collateral information with regard to her past psychiatric history.  Many of her symptoms sound more like posttraumatic stress disorder and perhaps some borderline features rather than schizophrenia.  Also the fact that she went from being so agitated to normal within 24 hours of the long-acting injectable paliperidone really does not conform to metabolism of that drug.  I will have social work contact the patient's family and see if we can give any additional information.  No change in her medications today.  I may consider adding oral paliperidone at bedtime if it seems as though the diagnosis of schizophrenia is correct.  1.  Patient received paliperidone long-acting injection 234 mg on 02/13/2020.  If we continue this drug she will get the supplement in approximately 7 days.  This is for schizophrenia. 2.  Continue trazodone 50 mg p.o. nightly as needed insomnia. 3.  Contact family if possible for collateral information. 4.  Disposition planning-in progress.  Antonieta Pert, MD 02/14/2020, 11:06 AM

## 2020-02-15 MED ORDER — PALIPERIDONE PALMITATE ER 156 MG/ML IM SUSY: 156.0000 mg | PREFILLED_SYRINGE | Freq: Once | INTRAMUSCULAR | 0 refills | Status: DC

## 2020-02-15 MED ORDER — HYDROXYZINE HCL 25 MG PO TABS: 25.0000 mg | ORAL_TABLET | Freq: Three times a day (TID) | ORAL | 0 refills | Status: DC | PRN

## 2020-02-15 MED ORDER — PALIPERIDONE PALMITATE ER 156 MG/ML IM SUSY
156.0000 mg | PREFILLED_SYRINGE | Freq: Once | INTRAMUSCULAR | Status: DC
  Filled 2020-02-15: qty 1

## 2020-02-15 NOTE — [facility] Group Notes (Signed)
Feelings Around Relapse 02/15/2020 1PM  Type of Therapy and Topic:  Group Therapy:  Feelings around Relapse and Recovery  Participation Level:  Did Not Attend   Description of Group:    Patients in this group will discuss emotions they experience before and after a relapse. They will process how experiencing these feelings, or avoidance of experiencing them, relates to having a relapse. Facilitator will guide patients to explore emotions they have related to recovery. Patients will be encouraged to process which emotions are more powerful. They will be guided to discuss the emotional reaction significant others in their lives may have to patients' relapse or recovery. Patients will be assisted in exploring ways to respond to the emotions of others without this contributing to a relapse.  Therapeutic Goals: 1. Patient will identify two or more emotions that lead to a relapse for them 2. Patient will identify two emotions that result when they relapse 3. Patient will identify two emotions related to recovery 4. Patient will demonstrate ability to communicate their needs through discussion and/or role plays   Summary of Patient Progress:     Therapeutic Modalities:   Cognitive Behavioral Therapy Solution-Focused Therapy Assertiveness Training Relapse Prevention Therapy   Shaneil Yazdi T Chanise Habeck, LCSW 02/15/2020 2:32 PM   

## 2020-02-15 NOTE — Progress Notes (Signed)
Discharge Note:   Pt discharged at 12:00pm, left with her mother, her ride. Upon discharge pt is alert and oriented to person, place, time and situation. Pt is calm, cooperative, denies suicidal and homicidal ideation, denies feelings of depression and anxiety. Pt was given her discharge instructions which include outpatient follow up appointments, and discharge medication sent to pt's pharmacy for pick up, also given discharge medication education; pt verbalized understanding of all.  All personal belongings returned to pt upon discharge. Pt voiced no complaints, no distress noted or reported.

## 2020-02-15 NOTE — Discharge Summary (Signed)
Physician Discharge Summary Note  Patient:  Carol Flowers is an 19 y.o., female MRN:  [id number] DOB:  Mar 01, 2001 Patient phone:  [phone] (home)  Patient address:   [address] [place] [address],  Total Time spent with patient: 20 minutes  Date of Admission:  02/13/2020 Date of Discharge: 02/15/2020  Reason for Admission: Suicidality and psychosis  Principal Problem: <principal problem not specified> Discharge Diagnoses: Active Problems:   Schizophrenia [facility])   Past Psychiatric History: Reportedly diagnosed with schizophrenia secondary to chronic auditory hallucinations.  The patient did admit to previous psychiatric admissions.  She also admitted to multiple medications but none had been effective.  Past Medical History:  Past Medical History:  Diagnosis Date  . Anemia   . Mandible fracture [facility])     Past Surgical History:  Procedure Laterality Date  . NO PAST SURGERIES     Family History: History reviewed. No pertinent family history. Family Psychiatric  History: See admission H&P Social History:  Social History   Substance and Sexual Activity  Alcohol Use Never     Social History   Substance and Sexual Activity  Drug Use Never    Social History   Socioeconomic History  . Marital status: Married    Spouse name: Not on file  . Number of children: Not on file  . Years of education: Not on file  . Highest education level: Not on file  Occupational History  . Not on file  Tobacco Use  . Smoking status: Current Every Day Smoker    Packs/day: 0.25  . Smokeless tobacco: Never Used  Substance and Sexual Activity  . Alcohol use: Never  . Drug use: Never  . Sexual activity: Not on file  Other Topics Concern  . Not on file  Social History Narrative   ** Merged History Encounter **       Social Determinants of Health   Financial Resource Strain:   . Difficulty of Paying Living Expenses:   Food Insecurity:   . Worried About Charity fundraiser  in the Last Year:   . Arboriculturist in the Last Year:   Transportation Needs:   . Film/video editor (Medical):   Marland Kitchen Lack of Transportation (Non-Medical):   Physical Activity:   . Days of Exercise per Week:   . Minutes of Exercise per Session:   Stress:   . Feeling of Stress :   Social Connections:   . Frequency of Communication with Friends and Family:   . Frequency of Social Gatherings with Friends and Family:   . Attends Religious Services:   . Active Member of Clubs or Organizations:   . Attends Archivist Meetings:   Marland Kitchen Marital Status:     Hospital Course: Patient is seen and examined.  Patient is a 19 year old female with a reported past psychiatric history significant for schizophrenia, possible posttraumatic stress disorder.  The patient presented to the [facility] for evaluation.  She was currently having auditory hallucinations that it continued for at least a year without remorse.  She stated that over the last several days prior to admission she had begun to have visual hallucinations, and suicidal ideation.  She was admitted to the hospital for evaluation and stabilization.  The decision was made by the attending physician to give her the long-acting paliperidone injection.  This was given on 02/13/2020.  I met the patient on 02/14/2020.  At that time she stated she was doing very  well.  She denied any auditory or visual hallucinations.  She denied any psychotic symptoms.  She denied any suicidal or homicidal ideation.  We discussed the fact that it was probably unlikely that the paliperidone and had an effect that rapidly.  But she stated that it was more effective and she had tolerated better than any medicine she had had before.  During the course of the hospitalization between 5/10 and 02/15/2020 she did very well.  There was no evidence of any psychotic symptoms.  She denied any suicidal or homicidal ideation.  Social work contacted her mother to  gain some collateral information, but unfortunately was not beneficial with regard to her previous diagnosis.  On 02/15/2020 she was neither homicidal, suicidal, psychotic.  She stated she was ready to be discharged home, and we decided to discharge her that date.  She was given a prescription for a follow-up injection of the paliperidone long-acting 154 mg when she is seen in follow-up.  Physical Findings: AIMS:  , ,  ,  ,    CIWA:    COWS:     Musculoskeletal: Strength & Muscle Tone: within normal limits Gait & Station: normal Patient leans: N/A  Psychiatric Specialty Exam: Physical Exam  Nursing note and vitals reviewed. Constitutional: She is oriented to person, place, and time. She appears well-developed and well-nourished.  HENT:  Head: Normocephalic and atraumatic.  Respiratory: Effort normal.  Neurological: She is alert and oriented to person, place, and time.    Review of Systems  Blood pressure 108/79, pulse (!) 108, temperature 98.6 F (37 C), temperature source Oral, resp. rate 18, height 5' 3"  (1.6 m), weight 52 kg, SpO2 100 %.Body mass index is 20.31 kg/m.  General Appearance: Casual  Eye Contact:  Fair  Speech:  Normal Rate  Volume:  Normal  Mood:  Euthymic  Affect:  Congruent  Thought Process:  Coherent and Descriptions of Associations: Intact  Orientation:  Full (Time, Place, and Person)  Thought Content:  Logical  Suicidal Thoughts:  No  Homicidal Thoughts:  No  Memory:  Immediate;   Fair Recent;   Fair Remote;   Fair  Judgement:  Intact  Insight:  Fair  Psychomotor Activity:  Normal  Concentration:  Concentration: Good and Attention Span: Good  Recall:  Good  Fund of Knowledge:  Good  Language:  Good  Akathisia:  Negative  Handed:  Right  AIMS (if indicated):     Assets:  Desire for Improvement Resilience  ADL's:  Intact  Cognition:  WNL  Sleep:  Number of Hours: 7        Has this patient used any form of tobacco in the last 30 days?  (Cigarettes, Smokeless Tobacco, Cigars, and/or Pipes) Yes, No  Blood Alcohol level:  Lab Results  Component Value Date   ETH <10 02/12/2020   ETH <10 49/20/1007    Metabolic Disorder Labs:  No results found for: HGBA1C, MPG No results found for: PROLACTIN No results found for: CHOL, TRIG, HDL, CHOLHDL, VLDL, LDLCALC  See Psychiatric Specialty Exam and Suicide Risk Assessment completed by Attending Physician prior to discharge.  Discharge destination:  Home  Is patient on multiple antipsychotic therapies at discharge:  No   Has Patient had three or more failed trials of antipsychotic monotherapy by history:  No  Recommended Plan for Multiple Antipsychotic Therapies: NA  Discharge Instructions    Increase activity slowly   Complete by: As directed    Increase activity slowly   Complete  by: As directed      Allergies as of 02/15/2020      Reactions   Calamine       Medication List    TAKE these medications     Indication  hydrOXYzine 25 MG tablet Commonly known as: ATARAX/VISTARIL Take 1 tablet (25 mg total) by mouth 3 (three) times daily as needed for anxiety.  Indication: Feeling Anxious   levonorgestrel 20 MCG/24HR IUD Commonly known as: MIRENA 1 each by Intrauterine route once.  Indication: Birth Control Treatment   paliperidone 156 MG/ML Susy injection Commonly known as: INVEGA SUSTENNA Inject 1 mL (156 mg total) into the muscle once for 1 dose.  Indication: Schizophrenia      Follow-up Information    [facility] Follow up on 02/21/2020.   Why: You are scheduled to meet with Sherrian Divers, peer support specialist on Monday, May 17th at 7am via zoom. Thank you. Contact information: [place] [address] [phone]           Follow-up recommendations:  Activity:  Ad lib.  Comments: Follow-up with [facility] after discharge  Signed: Sharma Covert, MD 02/15/2020, 12:08 PM

## 2020-02-15 NOTE — [facility] Suicide Risk Assessment (Signed)
[facility] Discharge Suicide Risk Assessment   Principal Problem: <principal problem not specified> Discharge Diagnoses: Active Problems:   Schizophrenia (HCC)   Total Time spent with patient: 20 minutes  Musculoskeletal: Strength & Muscle Tone: within normal limits Gait & Station: normal Patient leans: N/A  Psychiatric Specialty Exam: Review of Systems  All other systems reviewed and are negative.   Blood pressure 108/79, pulse (!) 108, temperature 98.6 F (37 C), temperature source Oral, resp. rate 18, height 5\' 3"  (1.6 m), weight 52 kg, SpO2 100 %.Body mass index is 20.31 kg/m.  General Appearance: Casual  Eye Contact::  Good  Speech:  Normal Rate409  Volume:  Normal  Mood:  Euthymic  Affect:  Congruent  Thought Process:  Coherent and Descriptions of Associations: Intact  Orientation:  Full (Time, Place, and Person)  Thought Content:  Logical  Suicidal Thoughts:  No  Homicidal Thoughts:  No  Memory:  Immediate;   Good Recent;   Good Remote;   Good  Judgement:  Intact  Insight:  Fair  Psychomotor Activity:  Normal  Concentration:  Good  Recall:  Good  Fund of Knowledge:Fair  Language: Good  Akathisia:  Negative  Handed:  Right  AIMS (if indicated):     Assets:  Desire for Improvement Housing Resilience  Sleep:  Number of Hours: 7  Cognition: WNL  ADL's:  Intact   Mental Status Per Nursing Assessment::   On Admission:  NA  Demographic Factors:  Caucasian, Low socioeconomic status and Unemployed  Loss Factors: NA  Historical Factors: Impulsivity  Risk Reduction Factors:   Living with another person, especially a relative  Continued Clinical Symptoms:  Depression:   Impulsivity Schizophrenia:   Less than 57 years old  Cognitive Features That Contribute To Risk:  None    Suicide Risk:  Minimal: No identifiable suicidal ideation.  Patients presenting with no risk factors but with morbid ruminations; may be classified as minimal risk based on the  severity of the depressive symptoms  Follow-up Information    [facility] Follow up on 02/21/2020.   Why: You are scheduled to meet with 02/23/2020, peer support specialist on Monday, May 17th at 7am via zoom. Thank you. Contact information: [address] [place] [phone]           Plan Of Care/Follow-up recommendations:  Activity:  ad lib Other:  Get continued Invega injections at your follow up psychiatric appointment  [phone], MD 02/15/2020, 9:28 AM

## 2020-02-15 NOTE — Progress Notes (Signed)
Recreation Therapy Notes          Carol Flowers 02/15/2020 11:28 AM

## 2020-02-15 NOTE — Progress Notes (Signed)
  [facility] Adult Case Management Discharge Plan :  Will you be returning to the same living situation after discharge:  Yes,  pt says she will live with her mother At discharge, do you have transportation home?: Yes,  pt reports mother will pick her up Do you have the ability to pay for your medications: No.  Release of information consent forms completed and in the chart;  Patient's signature needed at discharge.  Patient to Follow up at: Follow-up Information    [facility] Follow up on 02/21/2020.   Why: You are scheduled to meet with Unk Pinto, peer support specialist on Monday, May 17th at 7am via zoom. Thank you. Contact information: [address] [place] [address] [phone]           Next level of care provider has access to [facility] Link:no  Safety Planning and Suicide Prevention discussed: Yes,  Gordy Clement, mother     Has patient been referred to the Quitline?: N/A patient is not a smoker  Patient has been referred for addiction treatment: N/A  Suzan Slick, LCSW 02/15/2020, 9:56 AM

## 2020-03-22 ENCOUNTER — Other Ambulatory Visit: Payer: Self-pay | Admitting: Psychiatry

## 2020-03-22 MED ORDER — PALIPERIDONE PALMITATE ER 156 MG/ML IM SUSY: 156.0000 mg | PREFILLED_SYRINGE | Freq: Once | INTRAMUSCULAR | 0 refills | Status: DC

## 2020-03-22 MED ORDER — HYDROXYZINE HCL 25 MG PO TABS: 25.0000 mg | ORAL_TABLET | Freq: Three times a day (TID) | ORAL | 0 refills | Status: DC | PRN

## 2020-04-22 ENCOUNTER — Encounter: Payer: Self-pay | Admitting: Emergency Medicine

## 2020-04-22 ENCOUNTER — Other Ambulatory Visit: Payer: Self-pay

## 2020-04-22 DIAGNOSIS — Z5321 Procedure and treatment not carried out due to patient leaving prior to being seen by health care provider: Secondary | ICD-10-CM | POA: Diagnosis not present

## 2020-04-22 DIAGNOSIS — R109 Unspecified abdominal pain: Secondary | ICD-10-CM | POA: Diagnosis not present

## 2020-04-22 LAB — CBC
HCT: 39.3 % (ref 36.0–46.0)
Hemoglobin: 13.4 g/dL (ref 12.0–15.0)
MCH: 31.7 pg (ref 26.0–34.0)
MCHC: 34.1 g/dL (ref 30.0–36.0)
MCV: 92.9 fL (ref 80.0–100.0)
Platelets: 311 10*3/uL (ref 150–400)
RBC: 4.23 MIL/uL (ref 3.87–5.11)
RDW: 11.8 % (ref 11.5–15.5)
WBC: 7.3 10*3/uL (ref 4.0–10.5)
nRBC: 0 % (ref 0.0–0.2)

## 2020-04-22 LAB — COMPREHENSIVE METABOLIC PANEL
ALT: 16 U/L (ref 0–44)
AST: 16 U/L (ref 15–41)
Albumin: 4.4 g/dL (ref 3.5–5.0)
Alkaline Phosphatase: 50 U/L (ref 38–126)
Anion gap: 6 (ref 5–15)
BUN: 8 mg/dL (ref 6–20)
CO2: 30 mmol/L (ref 22–32)
Calcium: 9 mg/dL (ref 8.9–10.3)
Chloride: 104 mmol/L (ref 98–111)
Creatinine, Ser: 0.58 mg/dL (ref 0.44–1.00)
GFR calc Af Amer: >60 mL/min (ref >60)
GFR calc non Af Amer: >60 mL/min (ref >60)
Glucose, Bld: 106 mg/dL — ABNORMAL HIGH (ref 70–99)
Potassium: 4 mmol/L (ref 3.5–5.1)
Sodium: 140 mmol/L (ref 135–145)
Total Bilirubin: 0.4 mg/dL (ref 0.3–1.2)
Total Protein: 8 g/dL (ref 6.5–8.1)

## 2020-04-22 LAB — LIPASE, BLOOD: Lipase: 22 U/L (ref 11–51)

## 2020-04-22 MED ORDER — ONDANSETRON 4 MG PO TBDP
4.0000 mg | ORAL_TABLET | Freq: Once | ORAL | Status: AC | PRN
  Administered 2020-04-22: 4 mg via ORAL
  Filled 2020-04-22: qty 1

## 2020-04-22 NOTE — ED Notes (Signed)
Unable to void at this time, pt given cup to take with her to the lobby.
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
## 2020-04-22 NOTE — ED Notes (Signed)
Patient given update on wait time. Patient verbalizes understanding.  

## 2020-04-23 ENCOUNTER — Emergency Department
Admission: EM | Admit: 2020-04-23 | Discharge: 2020-04-23 | Disposition: A | Discharge status: Home / Self Care | Payer: Medicaid Other | Attending: Emergency Medicine | Admitting: Emergency Medicine

## 2020-04-23 NOTE — ED Notes (Signed)
Pt not visualized in lobby or outside.
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
## 2022-10-28 ENCOUNTER — Encounter: Payer: Self-pay | Admitting: Family Medicine

## 2022-10-28 ENCOUNTER — Ambulatory Visit: Payer: Medicaid Other | Admitting: Family Medicine

## 2022-10-28 ENCOUNTER — Ambulatory Visit (LOCAL_COMMUNITY_HEALTH_CENTER): Payer: Medicaid Other | Admitting: Family Medicine

## 2022-10-28 VITALS — Ht 62.0 in | Wt 132.0 lb

## 2022-10-28 DIAGNOSIS — Z309 Encounter for contraceptive management, unspecified: Secondary | ICD-10-CM

## 2022-10-28 DIAGNOSIS — Z01419 Encounter for gynecological examination (general) (routine) without abnormal findings: Secondary | ICD-10-CM

## 2022-10-28 DIAGNOSIS — Z3009 Encounter for other general counseling and advice on contraception: Secondary | ICD-10-CM

## 2022-10-28 DIAGNOSIS — F209 Schizophrenia, unspecified: Secondary | ICD-10-CM

## 2022-10-28 NOTE — Progress Notes (Addendum)
[facility] Number: [phone]    Family Planning Visit- Initial Visit  Subjective:  Carol Flowers is a 22 y.o.  G0P0000   being seen today for an initial annual visit and to discuss reproductive life planning.  The patient is currently using IUD or IUS for pregnancy prevention. Patient reports   does not want a pregnancy in the next year.     report they are looking for a method that provides High efficacy at preventing pregnancy  Patient has the following medical conditions has Schizophrenia ([place]) on their problem list.  Chief Complaint  Patient presents with   Contraception    PE, IUD check    Patient denies concerns about self   Body mass index is 24.14 kg/m. - Patient is eligible for diabetes screening based on BMI> 25 and age >19?  not applicable JY7W ordered? not applicable  Patient reports 1  partner/s in last year. Desires STI screening?  No - declined  Has patient been screened once for HCV in the past?  No  No results found for: "HCVAB"  Does the patient have current drug use (including MJ), have a partner with drug use, and/or has been incarcerated since last result? No  If yes-- Screen for HCV through [facility]   Does the patient meet criteria for HBV testing? No  Criteria:  -Household, sexual or needle sharing contact with HBV -History of drug use -HIV positive -Those with known Hep C   Health Maintenance Due  Topic Date Due   COVID-19 Vaccine (1) Never done   HPV VACCINES (1 - 2-dose series) Never done   Hepatitis C Screening  Never done   PAP-Cervical Cytology Screening  Never done   PAP SMEAR-Modifier  Never done   INFLUENZA VACCINE  Never done    Review of Systems  Constitutional:  Negative for weight loss.  Eyes:  Negative for blurred vision.  Respiratory:  Negative for cough and shortness of breath.   Cardiovascular:  Negative for claudication.   Gastrointestinal:  Negative for nausea.  Genitourinary:  Negative for dysuria and frequency.  Skin:  Negative for rash.  Neurological:  Negative for headaches.  Endo/Heme/Allergies:  Does not bruise/bleed easily.  Psychiatric/Behavioral:  Positive for depression and hallucinations.     The following portions of the patient's history were reviewed and updated as appropriate: allergies, current medications, past family history, past medical history, past social history, past surgical history and problem list. Problem list updated.   See flowsheet for other program required questions.  Objective:   Vitals:   10/28/22 1351  Weight: 132 lb (59.9 kg)  Height: 5\' 2"  (1.575 m)    Physical Exam Vitals and nursing note reviewed.  Constitutional:      Appearance: Normal appearance.  HENT:     Head: Normocephalic and atraumatic.     Mouth/Throat:     Mouth: Mucous membranes are moist.     Pharynx: Oropharynx is clear. No oropharyngeal exudate or posterior oropharyngeal erythema.  Pulmonary:     Effort: Pulmonary effort is normal.  Abdominal:     General: Abdomen is flat.     Palpations: There is no mass.     Tenderness: There is no abdominal tenderness. There is no rebound.  Genitourinary:    General: Normal vulva.     Exam position: Lithotomy position.     Pubic Area: No rash or pubic lice.  Labia:        Right: No rash or lesion.        Left: No rash or lesion.      Vagina: Vaginal discharge present. No erythema, bleeding or lesions.     Cervix: No cervical motion tenderness, discharge, friability, lesion or erythema.     Uterus: Normal.      Adnexa: Right adnexa normal and left adnexa normal.     Rectum: Normal.       Comments: pH = 4  Small amt of white discharge present  Mole on left labia Lymphadenopathy:     Head:     Right side of head: No preauricular or posterior auricular adenopathy.     Left side of head: No preauricular or posterior auricular  adenopathy.     Cervical: No cervical adenopathy.     Upper Body:     Right upper body: No supraclavicular, axillary or epitrochlear adenopathy.     Left upper body: No supraclavicular, axillary or epitrochlear adenopathy.     Lower Body: No right inguinal adenopathy. No left inguinal adenopathy.  Skin:    General: Skin is warm and dry.     Findings: No rash.  Neurological:     Mental Status: She is alert and oriented to person, place, and time.     Assessment and Plan:  Carol Flowers is a 22 y.o. female presenting to the [facility] Department for an initial annual wellness/contraceptive visit  Contraception counseling: Reviewed options based on patient desire and reproductive life plan. Patient is interested in IUD or IUS. This was not provided to the patient today. Patient currently has IUD. Good for 3 more years  Risks, benefits, and typical effectiveness rates were reviewed.  Questions were answered.  Written information was also given to the patient to review.    The patient will follow up in  1 years for surveillance.  The patient was told to call with any further questions, or with any concerns about this method of contraception.  Emphasized use of condoms 100% of the time for STI prevention.  Need for ECP was assessed. Not indicated. IUD present  1. Well woman exam with routine gynecological exam -first pap test today -CBE not indicated per ACOG guidelines- due at 22 yo. -IUD in place- still has 3 years until needs to be changed  - IGP, rfx Aptima HPV ASCU  2. Schizophrenia, unspecified type ([place]) -On subaxone-- has psych group that she goes through for individual and group counseling -given card for [facility] -PHQ-9 score of 12 today- denies SI/HI   Return if symptoms worsen or fail to improve.  No future appointments.  Sharlet Salina, [place]

## 2022-10-30 LAB — IGP, RFX APTIMA HPV ASCU: PAP Smear Comment: 0

## 2024-07-11 ENCOUNTER — Emergency Department
Admission: EM | Admit: 2024-07-11 | Discharge: 2024-07-11 | Disposition: A | Discharge status: Home / Self Care | Payer: MEDICAID | Attending: Emergency Medicine | Admitting: Emergency Medicine

## 2024-07-11 ENCOUNTER — Emergency Department: Payer: MEDICAID

## 2024-07-11 ENCOUNTER — Other Ambulatory Visit: Payer: Self-pay

## 2024-07-11 DIAGNOSIS — W231XXA Caught, crushed, jammed, or pinched between stationary objects, initial encounter: Secondary | ICD-10-CM | POA: Insufficient documentation

## 2024-07-11 DIAGNOSIS — S92312B Displaced fracture of first metatarsal bone, left foot, initial encounter for open fracture: Secondary | ICD-10-CM | POA: Insufficient documentation

## 2024-07-11 DIAGNOSIS — F1910 Other psychoactive substance abuse, uncomplicated: Secondary | ICD-10-CM | POA: Insufficient documentation

## 2024-07-11 DIAGNOSIS — S99102B Unspecified physeal fracture of left metatarsal, initial encounter for open fracture: Secondary | ICD-10-CM

## 2024-07-11 DIAGNOSIS — M7989 Other specified soft tissue disorders: Secondary | ICD-10-CM | POA: Diagnosis not present

## 2024-07-11 DIAGNOSIS — Z23 Encounter for immunization: Secondary | ICD-10-CM | POA: Diagnosis not present

## 2024-07-11 DIAGNOSIS — Y9389 Activity, other specified: Secondary | ICD-10-CM | POA: Diagnosis not present

## 2024-07-11 DIAGNOSIS — S99922A Unspecified injury of left foot, initial encounter: Secondary | ICD-10-CM | POA: Diagnosis present

## 2024-07-11 LAB — CBC WITH DIFFERENTIAL/PLATELET
Abs Immature Granulocytes: 0.02 K/uL (ref 0.00–0.07)
Basophils Absolute: 0 K/uL (ref 0.0–0.1)
Basophils Relative: 1 %
Eosinophils Absolute: 0.1 K/uL (ref 0.0–0.5)
Eosinophils Relative: 1 %
HCT: 38.3 % (ref 36.0–46.0)
Hemoglobin: 13.1 g/dL (ref 12.0–15.0)
Immature Granulocytes: 0 %
Lymphocytes Relative: 37 %
Lymphs Abs: 2.9 K/uL (ref 0.7–4.0)
MCH: 30.1 pg (ref 26.0–34.0)
MCHC: 34.2 g/dL (ref 30.0–36.0)
MCV: 88 fL (ref 80.0–100.0)
Monocytes Absolute: 0.4 K/uL (ref 0.1–1.0)
Monocytes Relative: 5 %
Neutro Abs: 4.4 K/uL (ref 1.7–7.7)
Neutrophils Relative %: 56 %
Platelets: 371 K/uL (ref 150–400)
RBC: 4.35 MIL/uL (ref 3.87–5.11)
RDW: 11.4 % — ABNORMAL LOW (ref 11.5–15.5)
WBC: 7.9 K/uL (ref 4.0–10.5)
nRBC: 0 % (ref 0.0–0.2)

## 2024-07-11 LAB — BASIC METABOLIC PANEL WITH GFR
Anion gap: 11 (ref 5–15)
BUN: 7 mg/dL (ref 6–20)
CO2: 25 mmol/L (ref 22–32)
Calcium: 9.2 mg/dL (ref 8.9–10.3)
Chloride: 99 mmol/L (ref 98–111)
Creatinine, Ser: 0.67 mg/dL (ref 0.44–1.00)
GFR, Estimated: >60 mL/min (ref >60)
Glucose, Bld: 127 mg/dL — ABNORMAL HIGH (ref 70–99)
Potassium: 3.3 mmol/L — ABNORMAL LOW (ref 3.5–5.1)
Sodium: 135 mmol/L (ref 135–145)

## 2024-07-11 LAB — SAMPLE TO BLOOD BANK

## 2024-07-11 MED ORDER — NAPROXEN 500 MG PO TABS: 500.0000 mg | ORAL_TABLET | Freq: Two times a day (BID) | ORAL | 0 refills | Status: DC

## 2024-07-11 MED ORDER — OXYCODONE-ACETAMINOPHEN 5-325 MG PO TABS
1.0000 | ORAL_TABLET | Freq: Once | ORAL | Status: AC
  Administered 2024-07-11: 1 via ORAL
  Filled 2024-07-11: qty 1

## 2024-07-11 MED ORDER — DOXYCYCLINE HYCLATE 100 MG PO CAPS: 100.0000 mg | ORAL_CAPSULE | Freq: Two times a day (BID) | ORAL | 0 refills | Status: DC

## 2024-07-11 MED ORDER — SODIUM CHLORIDE 0.9 % IV BOLUS
1000.0000 mL | Freq: Once | INTRAVENOUS | Status: AC
  Administered 2024-07-11: 1000 mL via INTRAVENOUS

## 2024-07-11 MED ORDER — NAPROXEN 500 MG PO TABS
500.0000 mg | ORAL_TABLET | Freq: Once | ORAL | Status: AC
  Administered 2024-07-11: 500 mg via ORAL
  Filled 2024-07-11: qty 1

## 2024-07-11 MED ORDER — CEFAZOLIN SODIUM-DEXTROSE 2-4 GM/100ML-% IV SOLN
2.0000 g | Freq: Once | INTRAVENOUS | Status: AC
  Administered 2024-07-11: 2 g via INTRAVENOUS
  Filled 2024-07-11: qty 100

## 2024-07-11 MED ORDER — DOXYCYCLINE HYCLATE 100 MG PO TABS
100.0000 mg | ORAL_TABLET | Freq: Once | ORAL | Status: AC
  Administered 2024-07-11: 100 mg via ORAL
  Filled 2024-07-11: qty 1

## 2024-07-11 MED ORDER — LIDOCAINE-EPINEPHRINE 2 %-1:100000 IJ SOLN
20.0000 mL | Freq: Once | INTRAMUSCULAR | Status: AC
  Administered 2024-07-11: 20 mL
  Filled 2024-07-11: qty 1

## 2024-07-11 MED ORDER — TETANUS-DIPHTH-ACELL PERTUSSIS 5-2-15.5 LF-MCG/0.5 IM SUSP
0.5000 mL | Freq: Once | INTRAMUSCULAR | Status: AC
  Administered 2024-07-11: 0.5 mL via INTRAMUSCULAR
  Filled 2024-07-11: qty 0.5

## 2024-07-11 MED ORDER — MORPHINE SULFATE (PF) 4 MG/ML IV SOLN
4.0000 mg | Freq: Once | INTRAVENOUS | Status: AC
  Administered 2024-07-11: 4 mg via INTRAVENOUS
  Filled 2024-07-11: qty 1

## 2024-07-11 MED ORDER — HYDROMORPHONE HCL 1 MG/ML IJ SOLN
1.0000 mg | Freq: Once | INTRAMUSCULAR | Status: AC
  Administered 2024-07-11: 1 mg via INTRAVENOUS
  Filled 2024-07-11: qty 1

## 2024-07-11 MED ORDER — CEPHALEXIN 500 MG PO CAPS: 500.0000 mg | ORAL_CAPSULE | Freq: Four times a day (QID) | ORAL | 0 refills | Status: DC
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
## 2024-07-11 NOTE — ED Triage Notes (Addendum)
 Pt presents with injury to her L foot after it was caught in mower blades. Pt's foot is still in leather shoes that is cut. Able to visualize laceration through great toe to the bone with steady blood oozing from it.   Pt does admit to illicit fentanyl  with methamphetamine use with last dose prior to mowing about 1 hour PTA.

## 2024-07-11 NOTE — ED Notes (Signed)
 Pt stated she did not need instructions on how to use crutches because she has used them before

## 2024-07-20 ENCOUNTER — Ambulatory Visit: Payer: MEDICAID | Admitting: Podiatry

## 2024-07-20 ENCOUNTER — Encounter: Payer: Self-pay | Admitting: Podiatry

## 2024-07-20 VITALS — Ht 62.0 in | Wt 130.0 lb

## 2024-07-20 DIAGNOSIS — S91112A Laceration without foreign body of left great toe without damage to nail, initial encounter: Secondary | ICD-10-CM

## 2024-07-20 MED ORDER — HYDROCODONE-ACETAMINOPHEN 10-325 MG PO TABS: 1.0000 | ORAL_TABLET | Freq: Three times a day (TID) | ORAL | 0 refills | Status: AC | PRN

## 2024-07-20 MED ORDER — IBUPROFEN 800 MG PO TABS: 800.0000 mg | ORAL_TABLET | Freq: Three times a day (TID) | ORAL | 1 refills | Status: DC
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
## 2024-08-03 ENCOUNTER — Ambulatory Visit: Payer: MEDICAID | Admitting: Podiatry

## 2024-08-03 ENCOUNTER — Encounter: Payer: Self-pay | Admitting: Podiatry

## 2024-08-03 VITALS — Ht 62.0 in | Wt 130.0 lb

## 2024-08-03 DIAGNOSIS — S91112A Laceration without foreign body of left great toe without damage to nail, initial encounter: Secondary | ICD-10-CM
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
## 2024-08-24 ENCOUNTER — Ambulatory Visit: Payer: MEDICAID

## 2024-08-24 ENCOUNTER — Ambulatory Visit: Payer: MEDICAID | Admitting: Podiatry

## 2024-08-24 ENCOUNTER — Encounter: Payer: Self-pay | Admitting: Podiatry

## 2024-08-24 VITALS — Ht 62.0 in | Wt 130.0 lb

## 2024-08-24 DIAGNOSIS — S91112A Laceration without foreign body of left great toe without damage to nail, initial encounter: Secondary | ICD-10-CM
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
## 2024-10-19 ENCOUNTER — Ambulatory Visit: Payer: MEDICAID | Admitting: Podiatry

## 2024-11-05 ENCOUNTER — Emergency Department
Admission: EM | Admit: 2024-11-05 | Discharge: 2024-11-05 | Disposition: A | Discharge status: Home / Self Care | Payer: MEDICAID | Attending: Emergency Medicine | Admitting: Emergency Medicine

## 2024-11-05 ENCOUNTER — Ambulatory Visit (HOSPITAL_COMMUNITY)
Admission: EM | Admit: 2024-11-05 | Discharge: 2024-11-06 | Disposition: A | Payer: MEDICAID | Attending: Nurse Practitioner | Admitting: Nurse Practitioner

## 2024-11-05 ENCOUNTER — Emergency Department: Payer: MEDICAID

## 2024-11-05 ENCOUNTER — Other Ambulatory Visit: Payer: Self-pay

## 2024-11-05 DIAGNOSIS — R0789 Other chest pain: Secondary | ICD-10-CM | POA: Insufficient documentation

## 2024-11-05 DIAGNOSIS — F1123 Opioid dependence with withdrawal: Secondary | ICD-10-CM | POA: Diagnosis not present

## 2024-11-05 DIAGNOSIS — Z9151 Personal history of suicidal behavior: Secondary | ICD-10-CM | POA: Insufficient documentation

## 2024-11-05 DIAGNOSIS — R440 Auditory hallucinations: Secondary | ICD-10-CM | POA: Diagnosis not present

## 2024-11-05 DIAGNOSIS — F1120 Opioid dependence, uncomplicated: Secondary | ICD-10-CM | POA: Insufficient documentation

## 2024-11-05 DIAGNOSIS — Z62810 Personal history of physical and sexual abuse in childhood: Secondary | ICD-10-CM | POA: Insufficient documentation

## 2024-11-05 DIAGNOSIS — F209 Schizophrenia, unspecified: Secondary | ICD-10-CM | POA: Insufficient documentation

## 2024-11-05 DIAGNOSIS — F419 Anxiety disorder, unspecified: Secondary | ICD-10-CM | POA: Insufficient documentation

## 2024-11-05 DIAGNOSIS — F1510 Other stimulant abuse, uncomplicated: Secondary | ICD-10-CM | POA: Diagnosis not present

## 2024-11-05 DIAGNOSIS — F1990 Other psychoactive substance use, unspecified, uncomplicated: Secondary | ICD-10-CM

## 2024-11-05 DIAGNOSIS — R079 Chest pain, unspecified: Secondary | ICD-10-CM | POA: Diagnosis present

## 2024-11-05 DIAGNOSIS — F1910 Other psychoactive substance abuse, uncomplicated: Secondary | ICD-10-CM

## 2024-11-05 DIAGNOSIS — R45851 Suicidal ideations: Secondary | ICD-10-CM | POA: Insufficient documentation

## 2024-11-05 DIAGNOSIS — F1193 Opioid use, unspecified with withdrawal: Secondary | ICD-10-CM

## 2024-11-05 LAB — BASIC METABOLIC PANEL WITH GFR
Anion gap: 6 (ref 5–15)
BUN: 8 mg/dL (ref 6–20)
CO2: 28 mmol/L (ref 22–32)
Calcium: 9.1 mg/dL (ref 8.9–10.3)
Chloride: 102 mmol/L (ref 98–111)
Creatinine, Ser: 0.56 mg/dL (ref 0.44–1.00)
GFR, Estimated: >60 mL/min
Glucose, Bld: 102 mg/dL — ABNORMAL HIGH (ref 70–99)
Potassium: 4.1 mmol/L (ref 3.5–5.1)
Sodium: 135 mmol/L (ref 135–145)

## 2024-11-05 LAB — CBC
HCT: 38.5 % (ref 36.0–46.0)
Hemoglobin: 12.8 g/dL (ref 12.0–15.0)
MCH: 30.7 pg (ref 26.0–34.0)
MCHC: 33.2 g/dL (ref 30.0–36.0)
MCV: 92.3 fL (ref 80.0–100.0)
Platelets: 335 10*3/uL (ref 150–400)
RBC: 4.17 MIL/uL (ref 3.87–5.11)
RDW: 11.7 % (ref 11.5–15.5)
WBC: 7.7 10*3/uL (ref 4.0–10.5)
nRBC: 0 % (ref 0.0–0.2)

## 2024-11-05 LAB — POC URINE PREG, ED: Preg Test, Ur: NEGATIVE

## 2024-11-05 LAB — TROPONIN T, HIGH SENSITIVITY: Troponin T High Sensitivity: <6 ng/L (ref 0–19)

## 2024-11-05 LAB — ETHANOL: Alcohol, Ethyl (B): <15 mg/dL

## 2024-11-05 MED ORDER — ONDANSETRON HCL 4 MG/2ML IJ SOLN
4.0000 mg | Freq: Once | INTRAMUSCULAR | Status: AC
  Administered 2024-11-05: 4 mg via INTRAVENOUS
  Filled 2024-11-05: qty 2

## 2024-11-05 MED ORDER — BUPRENORPHINE HCL-NALOXONE HCL 8-2 MG SL FILM: 1.0000 | ORAL_FILM | Freq: Every day | SUBLINGUAL | 0 refills | Status: AC

## 2024-11-05 MED ORDER — CLONIDINE HCL 0.1 MG PO TABS: 0.1000 mg | ORAL_TABLET | Freq: Every day | ORAL | Status: DC

## 2024-11-05 MED ORDER — NAPROXEN 500 MG PO TABS: 500.0000 mg | ORAL_TABLET | Freq: Two times a day (BID) | ORAL | Status: DC | PRN

## 2024-11-05 MED ORDER — DIPHENHYDRAMINE HCL 50 MG/ML IJ SOLN: 50.0000 mg | Freq: Three times a day (TID) | INTRAMUSCULAR | Status: DC | PRN

## 2024-11-05 MED ORDER — LORAZEPAM 2 MG/ML IJ SOLN: 2.0000 mg | Freq: Three times a day (TID) | INTRAMUSCULAR | Status: DC | PRN

## 2024-11-05 MED ORDER — CLONIDINE HCL 0.1 MG PO TABS: 0.1000 mg | ORAL_TABLET | ORAL | Status: DC

## 2024-11-05 MED ORDER — METHOCARBAMOL 500 MG PO TABS: 500.0000 mg | ORAL_TABLET | Freq: Three times a day (TID) | ORAL | Status: DC | PRN

## 2024-11-05 MED ORDER — ALUM & MAG HYDROXIDE-SIMETH 200-200-20 MG/5ML PO SUSP: 30.0000 mL | ORAL | Status: DC | PRN

## 2024-11-05 MED ORDER — MAGNESIUM HYDROXIDE 400 MG/5ML PO SUSP: 30.0000 mL | Freq: Every day | ORAL | Status: DC | PRN

## 2024-11-05 MED ORDER — TRAZODONE HCL 50 MG PO TABS
50.0000 mg | ORAL_TABLET | Freq: Every evening | ORAL | Status: DC | PRN
  Administered 2024-11-06: 50 mg via ORAL
  Filled 2024-11-05: qty 1

## 2024-11-05 MED ORDER — DIPHENHYDRAMINE HCL 50 MG PO CAPS: 50.0000 mg | ORAL_CAPSULE | Freq: Three times a day (TID) | ORAL | Status: DC | PRN

## 2024-11-05 MED ORDER — HALOPERIDOL LACTATE 5 MG/ML IJ SOLN: 10.0000 mg | Freq: Three times a day (TID) | INTRAMUSCULAR | Status: DC | PRN

## 2024-11-05 MED ORDER — BUPRENORPHINE HCL-NALOXONE HCL 8-2 MG SL SUBL
1.0000 | SUBLINGUAL_TABLET | Freq: Once | SUBLINGUAL | Status: AC
  Administered 2024-11-05: 1 via SUBLINGUAL
  Filled 2024-11-05: qty 1

## 2024-11-05 MED ORDER — HYDROXYZINE HCL 25 MG PO TABS
25.0000 mg | ORAL_TABLET | Freq: Three times a day (TID) | ORAL | Status: DC | PRN
  Administered 2024-11-06: 25 mg via ORAL
  Filled 2024-11-05: qty 1

## 2024-11-05 MED ORDER — DICYCLOMINE HCL 20 MG PO TABS: 20.0000 mg | ORAL_TABLET | Freq: Four times a day (QID) | ORAL | Status: DC | PRN

## 2024-11-05 MED ORDER — HALOPERIDOL 5 MG PO TABS: 5.0000 mg | ORAL_TABLET | Freq: Three times a day (TID) | ORAL | Status: DC | PRN

## 2024-11-05 MED ORDER — LOPERAMIDE HCL 2 MG PO CAPS: 2.0000 mg | ORAL_CAPSULE | ORAL | Status: DC | PRN

## 2024-11-05 MED ORDER — LACTATED RINGERS IV BOLUS
1000.0000 mL | Freq: Once | INTRAVENOUS | Status: AC
  Administered 2024-11-05: 1000 mL via INTRAVENOUS

## 2024-11-05 MED ORDER — ONDANSETRON 4 MG PO TBDP: 4.0000 mg | ORAL_TABLET | Freq: Four times a day (QID) | ORAL | Status: DC | PRN

## 2024-11-05 MED ORDER — KETOROLAC TROMETHAMINE 30 MG/ML IJ SOLN
15.0000 mg | Freq: Once | INTRAMUSCULAR | Status: AC
  Administered 2024-11-05: 15 mg via INTRAVENOUS
  Filled 2024-11-05: qty 1

## 2024-11-05 MED ORDER — CLONIDINE HCL 0.1 MG PO TABS: 0.1000 mg | ORAL_TABLET | Freq: Four times a day (QID) | ORAL | Status: DC

## 2024-11-05 MED ORDER — ACETAMINOPHEN 325 MG PO TABS: 650.0000 mg | ORAL_TABLET | Freq: Four times a day (QID) | ORAL | Status: DC | PRN

## 2024-11-05 MED ORDER — HALOPERIDOL LACTATE 5 MG/ML IJ SOLN: 5.0000 mg | Freq: Three times a day (TID) | INTRAMUSCULAR | Status: DC | PRN

## 2024-11-05 NOTE — ED Provider Notes (Signed)
 "  [facility] Provider Note    Event Date/Time   First MD Initiated Contact with Patient 11/05/24 1633     (approximate)   History   Withdrawal, Chest Pain, and Hallucinations   HPI  Mylani  R Scullin is a 24 y.o. female who presents to the ED for evaluation of Withdrawal, Chest Pain, and Hallucinations   Patient presents to the ED with multiple days of chest pain in the setting of withdrawing from recreational fentanyl  and methamphetamines.  Has not used for the past 3 days, symptoms in the past 2 days.  No fevers, syncope.  Denies suicidal or homicidal ideations.  Reports a chronic degree of hallucinations, auditory hallucinations, bugs that she is seeing, that seem to be worsening in the setting of withdrawals.  She has a history of Suboxone  use and reports having a clinic in [place] that she can follow-up with for refills and continued care.  She explicitly is requesting Suboxone  as a desire to get clean and stop using opiates recreationally.  No IVDU.   Physical Exam   Triage Vital Signs: ED Triage Vitals  Encounter Vitals Group     BP 11/05/24 1529 (!) 125/90     Girls Systolic BP Percentile --      Girls Diastolic BP Percentile --      Boys Systolic BP Percentile --      Boys Diastolic BP Percentile --      Pulse Rate 11/05/24 1529 88     Resp 11/05/24 1529 18     Temp 11/05/24 1529 98.2 F (36.8 C)     Temp Source 11/05/24 1800 Axillary     SpO2 11/05/24 1529 99 %     Weight 11/05/24 1525 130 lb 1.1 oz (59 kg)     Height 11/05/24 1525 5' 2 (1.575 m)     Head Circumference --      Peak Flow --      Pain Score 11/05/24 1525 7     Pain Loc --      Pain Education --      Exclude from Growth Chart --     Most recent vital signs: Vitals:   11/05/24 1529 11/05/24 1800  BP: (!) 125/90 122/86  Pulse: 88 91  Resp: 18 20  Temp: 98.2 F (36.8 C) 98 F (36.7 C)  SpO2: 99% 100%    General: Awake, no distress.  Hunched over on her  side, CV:  Good peripheral perfusion.  Resp:  Normal effort.  Abd:  No distention.  MSK:  No deformity noted.  Neuro:  No focal deficits appreciated. Other:     ED Results / Procedures / Treatments   Labs (all labs ordered are listed, but only abnormal results are displayed) Labs Reviewed  BASIC METABOLIC PANEL WITH GFR - Abnormal; Notable for the following components:      Result Value   Glucose, Bld 102 (*)    All other components within normal limits  CBC  ETHANOL  POC URINE PREG, ED  TROPONIN T, HIGH SENSITIVITY  TROPONIN T, HIGH SENSITIVITY    EKG Sinus rhythm with a rate of 86 bpm.  Normal axis and intervals.  Signs of acute ischemia.  RADIOLOGY CXR interpreted by me without evidence of acute cardiopulmonary pathology.  Official radiology report(s): DG Chest 2 View Result Date: 11/05/2024 CLINICAL DATA:  Chest pain, withdrawal EXAM: CHEST - 2 VIEW COMPARISON:  08/02/2019 FINDINGS: The heart size and mediastinal contours are within normal limits.  Both lungs are clear. The visualized skeletal structures are unremarkable. IMPRESSION: No active cardiopulmonary disease. Electronically Signed   By: Ozell Daring M.D.   On: 11/05/2024 16:41    PROCEDURES and INTERVENTIONS:  Procedures  Medications  lactated ringers  bolus 1,000 mL (0 mLs Intravenous Stopped 11/05/24 1909)  ondansetron  (ZOFRAN ) injection 4 mg (4 mg Intravenous Given 11/05/24 1756)  ketorolac  (TORADOL ) 30 MG/ML injection 15 mg (15 mg Intravenous Given 11/05/24 1755)  buprenorphine -naloxone  (SUBOXONE ) 8-2 mg per SL tablet 1 tablet (1 tablet Sublingual Given 11/05/24 1756)     IMPRESSION / MDM / ASSESSMENT AND PLAN / ED COURSE  I reviewed the triage vital signs and the nursing notes.  Differential diagnosis includes, but is not limited to, polysubstance abuse, withdrawals, seizure, drug-seeking  {Patient presents with symptoms of an acute illness or injury that is potentially life-threatening.  Patient  presents with symptomatic withdrawals from opiates and methamphetamines suitable for outpatient management with Suboxone  and outpatient rehab resources.  Chest discomfort without signs of ischemia, doubt PE, negative troponin.  Normal CBC and metabolic panel, started Suboxone  with improved features.  Offered psychiatry evaluation but she declines.  Clinical Course as of 11/05/24 2347  Fri Nov 05, 2024  1745 3 days since last use of fentanyl  and meth, no IVDU, wants to get clean. No SI/HI, requesting suboxone , has a clinic in [place] she can f/u with. Has seen them before [DS]  1920 Reassessed and reevaluated.  Patient has a desire to go home does not want to stay, continues to report no SI or HI.  Reports hallucinations but no command auditory hallucinations.  I offered to have the patient stay and have psychiatry see her but she declines preferring to go home. [DS]    Clinical Course User Index [DS] Claudene Rover, MD     FINAL CLINICAL IMPRESSION(S) / ED DIAGNOSES   Final diagnoses:  Other chest pain  Polysubstance abuse (HCC)  Opiate withdrawal (HCC)     Rx / DC Orders   ED Discharge Orders          Ordered    Buprenorphine  HCl-Naloxone  HCl (SUBOXONE ) 8-2 MG FILM  Daily        11/05/24 1921             Note:  This document was prepared using Dragon voice recognition software and may include unintentional dictation errors.   Claudene Rover, MD 11/05/24 2348  "

## 2024-11-05 NOTE — ED Triage Notes (Addendum)
 PT BIB EMS from [facility] with complaints of chest pain and withdrawal from meth and fentanyl . Last used 72 hours ago. Nausea no vomiting. Pt states she has had auditory and visual hallucinations x 3 days. Increased irritability and anxiety.
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
## 2024-11-05 NOTE — ED Notes (Signed)
 PT states she's hearing voice of people telling her to leaving and seeing bugs and people in room. Endorses SI, denies Hi.
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
## 2024-11-05 NOTE — ED Notes (Signed)
 PT states she wants to try suboxone . States she wants to quit but doesn't have access to help.

## 2024-11-05 NOTE — ED Notes (Signed)
 PT spoke with EDP. Denies Si and Hi. States she wants to leave. D/C orders placed.

## 2024-11-05 NOTE — ED Notes (Signed)
 Coming [facility] from [facility]. Sitting in car, sharp in nature, no radiating, 8/10 pain. Some SOB, dizziness, nausea. Smokes meth and fentanyl , hasn't had any in approx 72 hours; trying to detox. Reports hx of cardiac arrhythmia.   324 asa, cbg 139,

## 2024-11-06 ENCOUNTER — Other Ambulatory Visit (HOSPITAL_COMMUNITY)
Admission: EM | Admit: 2024-11-06 | Discharge: 2024-11-09 | Disposition: A | Payer: MEDICAID | Source: Intra-hospital | Admitting: Nurse Practitioner

## 2024-11-06 ENCOUNTER — Encounter (HOSPITAL_COMMUNITY): Payer: Self-pay | Admitting: Nurse Practitioner

## 2024-11-06 DIAGNOSIS — F1510 Other stimulant abuse, uncomplicated: Secondary | ICD-10-CM | POA: Diagnosis not present

## 2024-11-06 DIAGNOSIS — F1910 Other psychoactive substance abuse, uncomplicated: Secondary | ICD-10-CM | POA: Diagnosis present

## 2024-11-06 DIAGNOSIS — F1994 Other psychoactive substance use, unspecified with psychoactive substance-induced mood disorder: Secondary | ICD-10-CM

## 2024-11-06 DIAGNOSIS — R45851 Suicidal ideations: Secondary | ICD-10-CM | POA: Diagnosis not present

## 2024-11-06 DIAGNOSIS — F1120 Opioid dependence, uncomplicated: Secondary | ICD-10-CM | POA: Diagnosis not present

## 2024-11-06 DIAGNOSIS — Z62810 Personal history of physical and sexual abuse in childhood: Secondary | ICD-10-CM | POA: Diagnosis not present

## 2024-11-06 DIAGNOSIS — F1520 Other stimulant dependence, uncomplicated: Secondary | ICD-10-CM

## 2024-11-06 DIAGNOSIS — F1190 Opioid use, unspecified, uncomplicated: Secondary | ICD-10-CM

## 2024-11-06 DIAGNOSIS — F419 Anxiety disorder, unspecified: Secondary | ICD-10-CM | POA: Diagnosis not present

## 2024-11-06 DIAGNOSIS — Z9151 Personal history of suicidal behavior: Secondary | ICD-10-CM | POA: Diagnosis not present

## 2024-11-06 DIAGNOSIS — F209 Schizophrenia, unspecified: Secondary | ICD-10-CM | POA: Diagnosis not present

## 2024-11-06 LAB — POCT URINE DRUG SCREEN - MANUAL ENTRY (I-SCREEN)
POC Amphetamine UR: POSITIVE — AB
POC Buprenorphine (BUP): NOT DETECTED
POC Cocaine UR: POSITIVE — AB
POC Marijuana UR: NOT DETECTED
POC Methadone UR: NOT DETECTED
POC Methamphetamine UR: NOT DETECTED
POC Morphine: POSITIVE — AB
POC Oxazepam (BZO): NOT DETECTED
POC Oxycodone UR: NOT DETECTED
POC Secobarbital (BAR): POSITIVE — AB

## 2024-11-06 LAB — CBC WITH DIFFERENTIAL/PLATELET
Abs Immature Granulocytes: 0.03 10*3/uL (ref 0.00–0.07)
Basophils Absolute: 0 10*3/uL (ref 0.0–0.1)
Basophils Relative: 0 %
Eosinophils Absolute: 0.2 10*3/uL (ref 0.0–0.5)
Eosinophils Relative: 1 %
HCT: 38.8 % (ref 36.0–46.0)
Hemoglobin: 13.1 g/dL (ref 12.0–15.0)
Immature Granulocytes: 0 %
Lymphocytes Relative: 26 %
Lymphs Abs: 2.7 10*3/uL (ref 0.7–4.0)
MCH: 30.9 pg (ref 26.0–34.0)
MCHC: 33.8 g/dL (ref 30.0–36.0)
MCV: 91.5 fL (ref 80.0–100.0)
Monocytes Absolute: 0.7 10*3/uL (ref 0.1–1.0)
Monocytes Relative: 6 %
Neutro Abs: 6.9 10*3/uL (ref 1.7–7.7)
Neutrophils Relative %: 67 %
Platelets: 340 10*3/uL (ref 150–400)
RBC: 4.24 MIL/uL (ref 3.87–5.11)
RDW: 11.8 % (ref 11.5–15.5)
WBC: 10.5 10*3/uL (ref 4.0–10.5)
nRBC: 0 % (ref 0.0–0.2)

## 2024-11-06 LAB — COMPREHENSIVE METABOLIC PANEL WITH GFR
ALT: 15 U/L (ref 0–44)
AST: 16 U/L (ref 15–41)
Albumin: 4 g/dL (ref 3.5–5.0)
Alkaline Phosphatase: 71 U/L (ref 38–126)
Anion gap: 11 (ref 5–15)
BUN: 7 mg/dL (ref 6–20)
CO2: 27 mmol/L (ref 22–32)
Calcium: 9.1 mg/dL (ref 8.9–10.3)
Chloride: 101 mmol/L (ref 98–111)
Creatinine, Ser: 0.51 mg/dL (ref 0.44–1.00)
GFR, Estimated: >60 mL/min
Glucose, Bld: 120 mg/dL — ABNORMAL HIGH (ref 70–99)
Potassium: 3.5 mmol/L (ref 3.5–5.1)
Sodium: 139 mmol/L (ref 135–145)
Total Bilirubin: 0.3 mg/dL (ref 0.0–1.2)
Total Protein: 6.7 g/dL (ref 6.5–8.1)

## 2024-11-06 LAB — ETHANOL: Alcohol, Ethyl (B): <15 mg/dL

## 2024-11-06 LAB — POCT PREGNANCY, URINE: Preg Test, Ur: NEGATIVE

## 2024-11-06 MED ORDER — DIPHENHYDRAMINE HCL 50 MG/ML IJ SOLN: 50.0000 mg | Freq: Three times a day (TID) | INTRAMUSCULAR | Status: DC | PRN

## 2024-11-06 MED ORDER — ONDANSETRON 4 MG PO TBDP: 4.0000 mg | ORAL_TABLET | Freq: Four times a day (QID) | ORAL | Status: DC | PRN

## 2024-11-06 MED ORDER — PALIPERIDONE ER 3 MG PO TB24
3.0000 mg | ORAL_TABLET | Freq: Two times a day (BID) | ORAL | Status: DC
  Administered 2024-11-06 – 2024-11-09 (×6): 3 mg via ORAL
  Filled 2024-11-06 (×6): qty 1

## 2024-11-06 MED ORDER — DIPHENHYDRAMINE HCL 50 MG PO CAPS: 50.0000 mg | ORAL_CAPSULE | Freq: Three times a day (TID) | ORAL | Status: DC | PRN

## 2024-11-06 MED ORDER — HALOPERIDOL LACTATE 5 MG/ML IJ SOLN: 10.0000 mg | Freq: Three times a day (TID) | INTRAMUSCULAR | Status: DC | PRN

## 2024-11-06 MED ORDER — ALUM & MAG HYDROXIDE-SIMETH 200-200-20 MG/5ML PO SUSP: 30.0000 mL | ORAL | Status: DC | PRN

## 2024-11-06 MED ORDER — LORAZEPAM 2 MG/ML IJ SOLN: 2.0000 mg | Freq: Three times a day (TID) | INTRAMUSCULAR | Status: DC | PRN

## 2024-11-06 MED ORDER — GABAPENTIN 100 MG PO CAPS
200.0000 mg | ORAL_CAPSULE | Freq: Three times a day (TID) | ORAL | Status: DC
  Administered 2024-11-06 – 2024-11-09 (×10): 200 mg via ORAL
  Filled 2024-11-06 (×10): qty 2

## 2024-11-06 MED ORDER — CLONIDINE HCL 0.1 MG PO TABS: 0.1000 mg | ORAL_TABLET | Freq: Every day | ORAL | Status: DC

## 2024-11-06 MED ORDER — METHOCARBAMOL 500 MG PO TABS
500.0000 mg | ORAL_TABLET | Freq: Three times a day (TID) | ORAL | Status: DC | PRN
  Administered 2024-11-06 – 2024-11-09 (×4): 500 mg via ORAL
  Filled 2024-11-06 (×4): qty 1

## 2024-11-06 MED ORDER — TRAZODONE HCL 50 MG PO TABS
50.0000 mg | ORAL_TABLET | Freq: Every evening | ORAL | Status: DC | PRN
  Administered 2024-11-06 – 2024-11-08 (×3): 50 mg via ORAL
  Filled 2024-11-06 (×3): qty 1

## 2024-11-06 MED ORDER — ACETAMINOPHEN 325 MG PO TABS: 650.0000 mg | ORAL_TABLET | Freq: Four times a day (QID) | ORAL | Status: DC | PRN

## 2024-11-06 MED ORDER — HALOPERIDOL 5 MG PO TABS: 5.0000 mg | ORAL_TABLET | Freq: Three times a day (TID) | ORAL | Status: DC | PRN

## 2024-11-06 MED ORDER — LOPERAMIDE HCL 2 MG PO CAPS: 2.0000 mg | ORAL_CAPSULE | ORAL | Status: DC | PRN

## 2024-11-06 MED ORDER — CLONIDINE HCL 0.1 MG PO TABS
0.1000 mg | ORAL_TABLET | ORAL | Status: DC
  Administered 2024-11-08 – 2024-11-09 (×3): 0.1 mg via ORAL
  Filled 2024-11-06 (×3): qty 1

## 2024-11-06 MED ORDER — NAPROXEN 500 MG PO TABS
500.0000 mg | ORAL_TABLET | Freq: Two times a day (BID) | ORAL | Status: DC | PRN
  Administered 2024-11-06 – 2024-11-09 (×3): 500 mg via ORAL
  Filled 2024-11-06 (×3): qty 1

## 2024-11-06 MED ORDER — DICYCLOMINE HCL 20 MG PO TABS: 20.0000 mg | ORAL_TABLET | Freq: Four times a day (QID) | ORAL | Status: DC | PRN

## 2024-11-06 MED ORDER — HYDROXYZINE HCL 25 MG PO TABS
25.0000 mg | ORAL_TABLET | Freq: Four times a day (QID) | ORAL | Status: DC | PRN
  Administered 2024-11-06 – 2024-11-08 (×3): 25 mg via ORAL
  Filled 2024-11-06 (×3): qty 1

## 2024-11-06 MED ORDER — HALOPERIDOL LACTATE 5 MG/ML IJ SOLN: 5.0000 mg | Freq: Three times a day (TID) | INTRAMUSCULAR | Status: DC | PRN

## 2024-11-06 MED ORDER — MAGNESIUM HYDROXIDE 400 MG/5ML PO SUSP: 30.0000 mL | Freq: Every day | ORAL | Status: DC | PRN

## 2024-11-06 MED ORDER — CLONIDINE HCL 0.1 MG PO TABS
0.1000 mg | ORAL_TABLET | Freq: Four times a day (QID) | ORAL | Status: AC
  Administered 2024-11-06 – 2024-11-07 (×4): 0.1 mg via ORAL
  Filled 2024-11-06 (×4): qty 1

## 2024-11-06 NOTE — ED Notes (Signed)
 Patient resting comfortably with no s/s of distress. RN gave report to [place], RN related to transfer. Requested to be taken over at 0430 per [facility], RN to room 166.
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
## 2024-11-06 NOTE — ED Notes (Signed)
 Patient denies pain and is resting comfortably.

## 2024-11-06 NOTE — Group Note (Signed)
 Group Topic: Relaxation  Group Date: 11/06/2024 Start Time: 1200 End Time: 1230 Facilitators: Liston Cooper PARAS, NT  Department: [facility]  Number of Participants: 4  Group Focus: relaxation Treatment Modality:  Psychoeducation Interventions utilized were group exercise Purpose: reinforce self-care  Name: Carol Flowers Date of Birth: 29-Dec-2000  MR: [id number]    Level of Participation: patient did not attend Quality of Participation:  Interactions with others:  Mood/Affect:  Triggers (if applicable):  Cognition:  Progress:  Response:  Plan:   Patients Problems:  Patient Active Problem List   Diagnosis Date Noted   Polysubstance abuse (HCC) 11/06/2024   Schizophrenia (HCC) 02/12/2020

## 2024-11-06 NOTE — Group Note (Signed)
 Group Topic: Emotional Regulation  Group Date: 11/06/2024 Start Time: 8069 End Time: 2000 Facilitators: Verdon Jacqualyn BRAVO, NT  Department: [facility]  Number of Participants: 7  Group Focus: clarity of thought Treatment Modality:  Individual Therapy Interventions utilized were group exercise Purpose: express feelings  Name: Carol Flowers Date of Birth: Feb 24, 2001  MR: [id number]    Level of Participation: moderate Quality of Participation: cooperative Interactions with others: gave feedback Mood/Affect: appropriate Triggers (if applicable): n/a Cognition: coherent/clear Progress: Moderate Response: n/a Plan: follow-up needed  Patients Problems:  Patient Active Problem List   Diagnosis Date Noted   Polysubstance abuse (HCC) 11/06/2024   Schizophrenia (HCC) 02/12/2020
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
## 2024-11-06 NOTE — ED Notes (Signed)
 RN spoke with patient A&Ox4 speaks very soft. Denies intent to harm self/others when asked. Denies A/VH or any physical complaints when asked. No acute distress noted. Active listening, support and encouragement provided. Routine safety checks conducted according to facility protocol. Encouraged patient to notify staff if thoughts of harm toward self or others arise. Patient verbalize understanding and agreement.RN spoke with patient A&Ox4. Denies intent to harm self/others when asked. Denies A/VH or any physical complaints when asked. No acute distress noted. Active listening, support and encouragement provided. Routine safety checks conducted according to facility protocol. Encouraged patient to notify staff if thoughts of harm toward self or others arise. Patient verbalize understanding and agreement. Meal, beverage and meds offered.

## 2024-11-06 NOTE — ED Notes (Signed)
 Pt resting isolative to room. Does not appear to be in any acute distress. RR even and unlabored. Environment secured. Will continue to monitor for safety.
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
## 2024-11-07 MED ORDER — ENSURE PLUS HIGH PROTEIN PO LIQD
237.0000 mL | Freq: Two times a day (BID) | ORAL | Status: DC
  Administered 2024-11-07 – 2024-11-09 (×4): 237 mL via ORAL

## 2024-11-07 NOTE — Group Note (Signed)
 Group Topic: Relapse and Recovery  Group Date: 11/07/2024 Start Time: 1315 End Time: 1400 Facilitators: Clodfelter-Simmons, Rainn Zupko L, NT  Department: [facility]  Number of Participants: 8  Group Focus: substance abuse education Treatment Modality:  Interpersonal Therapy Interventions utilized were patient education Purpose: relapse prevention strategies  Name: Carol Flowers Date of Birth: 27-Dec-2000  MR: [id number]    Level of Participation: Did not attend Quality of Participation: na Interactions with others: na Mood/Affect: na Triggers (if applicable): na Cognition: na Progress: Other Response: na Plan: patient will be encouraged to attend groups  Patients Problems:  Patient Active Problem List   Diagnosis Date Noted   Polysubstance abuse (HCC) 11/06/2024   Schizophrenia (HCC) 02/12/2020

## 2024-11-07 NOTE — Group Note (Signed)
 Group Topic: Positive Affirmations  Group Date: 11/07/2024 Start Time: 2000 End Time: 2030 Facilitators: Joshua Ellouise CROME  Department: [facility]  Number of Participants: 5  Group Focus: check in Treatment Modality:  Leisure Development Interventions utilized were support Purpose: reinforce self-care  Name: Carol Flowers Date of Birth: 2001/05/05  MR: [id number]    Level of Participation: Pt did not attend group after verbal prompt. Quality of Participation:  Interactions with others:  Mood/Affect:  Triggers (if applicable):  Cognition:  Progress:  Response:  Plan:   Patients Problems:  Patient Active Problem List   Diagnosis Date Noted   Polysubstance abuse (HCC) 11/06/2024   Schizophrenia (HCC) 02/12/2020

## 2024-11-07 NOTE — ED Provider Notes (Addendum)
 Behavioral Health Progress Note  Date and Time: 11/07/2024 11:27 AM Name: Carol Flowers  Carol Flowers MRN:  [id number]  Subjective:   Carol Flowers  stated  I am doing alright, just tired.  Carol Flowers  24 year old female seen and evaluated resting in bed.  Reports a history related to depression, substance abuse and schizophrenia.  Denied that she has been compliant with medications prior to admission.  Reports detoxing from amphetamines and fentanyl .  States that experiencing body pain.  She denied that she is ever detoxed before.  States she currently resides with her sister plans to follow back up after she is discharged from facility based crisis.  She denied suicidal or homicidal ideations.  Denied auditory visual hallucinations currently.  Patient was recently initiated on Invega  reported taking and tolerating well.  Continue as directed.  Reports seeking to be restarted on Suboxone .  Case staffed with attending psychiatrist will not reinitiate Suboxone  at this time due to no out side prescribing provider at discharge.  Advised patient with recommendation.  She was receptive.  Nursing staff reports patient not engaged in milieu.  Has been resting throughout most of the day.  Per treatment team RN, COWS 2.   - Reported low blood pressure 98/58 HR 78 initiated Ensure 237 ml supplement p.o. twice daily.  Encouraged increase fluid/hydration  Per HPI: Carol  R. Flowers is a 24 year old female with a psychiatric history of severe polysubstance use disorder (methamphetamine and fentanyl ), schizophrenia presented voluntarily to [facility] requesting detox treatment. Patient has a trauma history, mood lability, anxiety symptoms, nightmares, and intermittent auditory hallucinations.   Diagnosis:  Final diagnoses:  Substance induced mood disorder (HCC)  Amphetamine use disorder, severe (HCC)  Opioid use disorder    Total Time spent with patient: 15 minutes  Past Psychiatric History: Reports diagnosis related  schizophrenia, major depressive disorder and posttraumatic stress disorder. Past Medical History: Substance-induced documented trauma history Family History: N/A Family Psychiatric  History: N/A Social History: Denied that she currently employed resides in [employer]   Additional Social History:                         Sleep: Fair  Appetite:  Poor  Current Medications:  Current Facility-Administered Medications  Medication Dose Route Frequency Provider Last Rate Last Admin   acetaminophen  (TYLENOL ) tablet 650 mg  650 mg Oral Q6H PRN Ajibola, Ene A, NP       alum & mag hydroxide-simeth (MAALOX/MYLANTA) 200-200-20 MG/5ML suspension 30 mL  30 mL Oral Q4H PRN Ajibola, Ene A, NP       cloNIDine  (CATAPRES ) tablet 0.1 mg  0.1 mg Oral QID Ajibola, Ene A, NP   0.1 mg at 11/07/24 0910   Followed by   NOREEN ON 11/08/2024] cloNIDine  (CATAPRES ) tablet 0.1 mg  0.1 mg Oral BH-qamhs Ajibola, Ene A, NP       Followed by   NOREEN ON 11/10/2024] cloNIDine  (CATAPRES ) tablet 0.1 mg  0.1 mg Oral QAC breakfast Ajibola, Ene A, NP       dicyclomine  (BENTYL ) tablet 20 mg  20 mg Oral Q6H PRN Ajibola, Ene A, NP       haloperidol  (HALDOL ) tablet 5 mg  5 mg Oral TID PRN Ajibola, Ene A, NP       And   diphenhydrAMINE  (BENADRYL ) capsule 50 mg  50 mg Oral TID PRN Ajibola, Ene A, NP       haloperidol  lactate (HALDOL ) injection 5 mg  5 mg Intramuscular TID PRN Ajibola, Ene  A, NP       And   diphenhydrAMINE  (BENADRYL ) injection 50 mg  50 mg Intramuscular TID PRN Ajibola, Ene A, NP       And   LORazepam  (ATIVAN ) injection 2 mg  2 mg Intramuscular TID PRN Ajibola, Ene A, NP       haloperidol  lactate (HALDOL ) injection 10 mg  10 mg Intramuscular TID PRN Ajibola, Ene A, NP       And   diphenhydrAMINE  (BENADRYL ) injection 50 mg  50 mg Intramuscular TID PRN Ajibola, Ene A, NP       And   LORazepam  (ATIVAN ) injection 2 mg  2 mg Intramuscular TID PRN Ajibola, Ene A, NP       gabapentin  (NEURONTIN )  capsule 200 mg  200 mg Oral TID Nkwenti, Doris, NP   200 mg at 11/07/24 9089   hydrOXYzine  (ATARAX ) tablet 25 mg  25 mg Oral Q6H PRN Ajibola, Ene A, NP   25 mg at 11/06/24 2135   loperamide  (IMODIUM ) capsule 2-4 mg  2-4 mg Oral PRN Ajibola, Ene A, NP       magnesium  hydroxide (MILK OF MAGNESIA) suspension 30 mL  30 mL Oral Daily PRN Ajibola, Ene A, NP       methocarbamol  (ROBAXIN ) tablet 500 mg  500 mg Oral Q8H PRN Ajibola, Ene A, NP   500 mg at 11/06/24 2135   naproxen  (NAPROSYN ) tablet 500 mg  500 mg Oral BID PRN Ajibola, Ene A, NP   500 mg at 11/06/24 2135   ondansetron  (ZOFRAN -ODT) disintegrating tablet 4 mg  4 mg Oral Q6H PRN Ajibola, Ene A, NP       paliperidone  (INVEGA ) 24 hr tablet 3 mg  3 mg Oral BID Tex Drilling, NP   3 mg at 11/07/24 0910   traZODone  (DESYREL ) tablet 50 mg  50 mg Oral QHS PRN Ajibola, Ene A, NP   50 mg at 11/06/24 2135   Current Outpatient Medications  Medication Sig Dispense Refill   ibuprofen  (ADVIL ) 200 MG tablet Take 200 mg by mouth every 6 (six) hours as needed for mild pain (pain score 1-3).     Buprenorphine  HCl-Naloxone  HCl (SUBOXONE ) 8-2 MG FILM Place 1 Film under the tongue daily for 5 days. 5 Film 0   levonorgestrel (MIRENA) 20 MCG/24HR IUD 1 each by Intrauterine route once.      Labs  Lab Results:  Admission on 11/05/2024, Discharged on 11/06/2024  Component Date Value Ref Range Status   Sodium 11/06/2024 139  135 - 145 mmol/L Final   Potassium 11/06/2024 3.5  3.5 - 5.1 mmol/L Final   Chloride 11/06/2024 101  98 - 111 mmol/L Final   CO2 11/06/2024 27  22 - 32 mmol/L Final   Glucose, Bld 11/06/2024 120 (H)  70 - 99 mg/dL Final   Glucose reference range applies only to samples taken after fasting for at least 8 hours.   BUN 11/06/2024 7  6 - 20 mg/dL Final   Creatinine, Ser 11/06/2024 0.51  0.44 - 1.00 mg/dL Final   Calcium 98/68/7973 9.1  8.9 - 10.3 mg/dL Final   Total Protein 98/68/7973 6.7  6.5 - 8.1 g/dL Final   Albumin 98/68/7973 4.0  3.5 -  5.0 g/dL Final   AST 98/68/7973 16  15 - 41 U/L Final   ALT 11/06/2024 15  0 - 44 U/L Final   Alkaline Phosphatase 11/06/2024 71  38 - 126 U/L Final   Total Bilirubin 11/06/2024 0.3  0.0 - 1.2 mg/dL Final   GFR, Estimated 11/06/2024 >60  >60 mL/min Final   Comment: (NOTE) Calculated using the CKD-EPI Creatinine Equation (2021)    Anion gap 11/06/2024 11  5 - 15 Final   Performed at [facility], [address] [place], [place] [address]   WBC 11/06/2024 10.5  4.0 - 10.5 K/uL Final   RBC 11/06/2024 4.24  3.87 - 5.11 MIL/uL Final   Hemoglobin 11/06/2024 13.1  12.0 - 15.0 g/dL Final   HCT 98/68/7973 38.8  36.0 - 46.0 % Final   MCV 11/06/2024 91.5  80.0 - 100.0 fL Final   MCH 11/06/2024 30.9  26.0 - 34.0 pg Final   MCHC 11/06/2024 33.8  30.0 - 36.0 g/dL Final   RDW 98/68/7973 11.8  11.5 - 15.5 % Final   Platelets 11/06/2024 340  150 - 400 K/uL Final   nRBC 11/06/2024 0.0  0.0 - 0.2 % Final   Neutrophils Relative % 11/06/2024 67  % Final   Neutro Abs 11/06/2024 6.9  1.7 - 7.7 K/uL Final   Lymphocytes Relative 11/06/2024 26  % Final   Lymphs Abs 11/06/2024 2.7  0.7 - 4.0 K/uL Final   Monocytes Relative 11/06/2024 6  % Final   Monocytes Absolute 11/06/2024 0.7  0.1 - 1.0 K/uL Final   Eosinophils Relative 11/06/2024 1  % Final   Eosinophils Absolute 11/06/2024 0.2  0.0 - 0.5 K/uL Final   Basophils Relative 11/06/2024 0  % Final   Basophils Absolute 11/06/2024 0.0  0.0 - 0.1 K/uL Final   Immature Granulocytes 11/06/2024 0  % Final   Abs Immature Granulocytes 11/06/2024 0.03  0.00 - 0.07 K/uL Final   Performed at [facility], [address], [place], [place] [address]   Alcohol, Ethyl (B) 11/06/2024 <15  <15 mg/dL Final   Comment: (NOTE) For medical purposes only. Performed at [facility], [address] [place], [place] [address]    POC Amphetamine UR 11/06/2024 Positive (A)  NONE DETECTED (Cut Off Level 1000 ng/mL) Final   POC Secobarbital (BAR) 11/06/2024  Positive (A)  NONE DETECTED (Cut Off Level 300 ng/mL) Final   POC Buprenorphine  (BUP) 11/06/2024 None Detected  NONE DETECTED (Cut Off Level 10 ng/mL) Final   POC Oxazepam (BZO) 11/06/2024 None Detected  NONE DETECTED (Cut Off Level 300 ng/mL) Final   POC Cocaine UR 11/06/2024 Positive (A)  NONE DETECTED (Cut Off Level 300 ng/mL) Final   POC Methamphetamine UR 11/06/2024 None Detected  NONE DETECTED (Cut Off Level 1000 ng/mL) Final   POC Morphine  11/06/2024 Positive (A)  NONE DETECTED (Cut Off Level 300 ng/mL) Final   POC Methadone UR 11/06/2024 None Detected  NONE DETECTED (Cut Off Level 300 ng/mL) Final   POC Oxycodone  UR 11/06/2024 None Detected  NONE DETECTED (Cut Off Level 100 ng/mL) Final   POC Marijuana UR 11/06/2024 None Detected  NONE DETECTED (Cut Off Level 50 ng/mL) Final   Preg Test, Ur 11/06/2024 NEGATIVE  NEGATIVE Final   Comment:        THE SENSITIVITY OF THIS METHODOLOGY IS >20 mIU/mL.   Admission on 11/05/2024, Discharged on 11/05/2024  Component Date Value Ref Range Status   WBC 11/05/2024 7.7  4.0 - 10.5 K/uL Final   RBC 11/05/2024 4.17  3.87 - 5.11 MIL/uL Final   Hemoglobin 11/05/2024 12.8  12.0 - 15.0 g/dL Final   HCT 98/69/7973 38.5  36.0 - 46.0 % Final   MCV 11/05/2024  92.3  80.0 - 100.0 fL Final   MCH 11/05/2024 30.7  26.0 - 34.0 pg Final   MCHC 11/05/2024 33.2  30.0 - 36.0 g/dL Final   RDW 98/69/7973 11.7  11.5 - 15.5 % Final   Platelets 11/05/2024 335  150 - 400 K/uL Final   nRBC 11/05/2024 0.0  0.0 - 0.2 % Final   Performed at [facility], [address] [place], [place] [address]   Preg Test, Ur 11/05/2024 Negative  Negative Final   Alcohol, Ethyl (B) 11/05/2024 <15  <15 mg/dL Final   Comment: (NOTE) For medical purposes only. Performed at [facility], [address] [place], [place] [address]    Sodium 11/05/2024 135  135 - 145 mmol/L Final   Potassium 11/05/2024 4.1  3.5 - 5.1 mmol/L Final   Chloride 11/05/2024 102  98 - 111  mmol/L Final   CO2 11/05/2024 28  22 - 32 mmol/L Final   Glucose, Bld 11/05/2024 102 (H)  70 - 99 mg/dL Final   Glucose reference range applies only to samples taken after fasting for at least 8 hours.   BUN 11/05/2024 8  6 - 20 mg/dL Final   Creatinine, Ser 11/05/2024 0.56  0.44 - 1.00 mg/dL Final   Calcium 98/69/7973 9.1  8.9 - 10.3 mg/dL Final   GFR, Estimated 11/05/2024 >60  >60 mL/min Final   Comment: (NOTE) Calculated using the CKD-EPI Creatinine Equation (2021)    Anion gap 11/05/2024 6  5 - 15 Final   Performed at [facility], [address] [place], [place] [address]   Troponin T High Sensitivity 11/05/2024 <6  0 - 19 ng/L Final   Comment: (NOTE) Biotin concentrations > 1000 ng/mL falsely decrease TnT results.  Serial cardiac troponin measurements are suggested.  Refer to the Links section for chest pain algorithms and additional  guidance. Performed at [facility], [address] [place], [place] [address]   Admission on 07/11/2024, Discharged on 07/11/2024  Component Date Value Ref Range Status   Sodium 07/11/2024 135  135 - 145 mmol/L Final   Potassium 07/11/2024 3.3 (L)  3.5 - 5.1 mmol/L Final   Chloride 07/11/2024 99  98 - 111 mmol/L Final   CO2 07/11/2024 25  22 - 32 mmol/L Final   Glucose, Bld 07/11/2024 127 (H)  70 - 99 mg/dL Final   Glucose reference range applies only to samples taken after fasting for at least 8 hours.   BUN 07/11/2024 7  6 - 20 mg/dL Final   Creatinine, Ser 07/11/2024 0.67  0.44 - 1.00 mg/dL Final   Calcium 89/94/7974 9.2  8.9 - 10.3 mg/dL Final   GFR, Estimated 07/11/2024 >60  >60 mL/min Final   Comment: (NOTE) Calculated using the CKD-EPI Creatinine Equation (2021)    Anion gap 07/11/2024 11  5 - 15 Final   Performed at [facility], [address] [place], [place] [address]   WBC 07/11/2024 7.9  4.0 - 10.5 K/uL Final   RBC 07/11/2024 4.35  3.87 - 5.11 MIL/uL Final   Hemoglobin 07/11/2024 13.1  12.0 -  15.0 g/dL Final   HCT 89/94/7974 38.3  36.0 - 46.0 % Final   MCV 07/11/2024 88.0  80.0 - 100.0 fL Final   MCH 07/11/2024 30.1  26.0 - 34.0 pg Final   MCHC 07/11/2024 34.2  30.0 - 36.0 g/dL Final   RDW 89/94/7974 11.4 (L)  11.5 - 15.5 % Final   Platelets 07/11/2024 371  150 - 400 K/uL Final   nRBC 07/11/2024 0.0  0.0 - 0.2 % Final   Neutrophils Relative % 07/11/2024 56  % Final   Neutro Abs 07/11/2024 4.4  1.7 - 7.7 K/uL Final   Lymphocytes Relative 07/11/2024 37  % Final   Lymphs Abs 07/11/2024 2.9  0.7 - 4.0 K/uL Final   Monocytes Relative 07/11/2024 5  % Final   Monocytes Absolute 07/11/2024 0.4  0.1 - 1.0 K/uL Final   Eosinophils Relative 07/11/2024 1  % Final   Eosinophils Absolute 07/11/2024 0.1  0.0 - 0.5 K/uL Final   Basophils Relative 07/11/2024 1  % Final   Basophils Absolute 07/11/2024 0.0  0.0 - 0.1 K/uL Final   Immature Granulocytes 07/11/2024 0  % Final   Abs Immature Granulocytes 07/11/2024 0.02  0.00 - 0.07 K/uL Final   Performed at [facility], [address] [place], [place] [address]   Blood Bank Specimen 07/11/2024 SAMPLE AVAILABLE FOR TESTING   Final   Sample Expiration 07/11/2024    Final                   Value:07/14/2024,2359 Performed at [facility], [address] [place], [place] [address]     Blood Alcohol level:  Lab Results  Component Value Date   [facility] <15 11/06/2024   ETH <15 11/05/2024    Metabolic Disorder Labs: No results found for: HGBA1C, MPG No results found for: PROLACTIN No results found for: CHOL, TRIG, HDL, CHOLHDL, VLDL, LDLCALC  Therapeutic Lab Levels: No results found for: LITHIUM No results found for: VALPROATE No results found for: CBMZ  Physical Findings   AUDIT    Flowsheet Row Admission (Discharged) from 02/13/2020 in [facility] INPATIENT BEHAVIORAL MEDICINE  Alcohol Use Disorder Identification Test Final Score (AUDIT) 0   PHQ2-9    Flowsheet Row ED from 11/06/2024 in [facility] Clinical Support from 10/28/2022 in [facility]  PHQ-2 Total Score 2 5  PHQ-9 Total Score 9 12   Flowsheet Row ED from 11/06/2024 in [facility] Most recent reading at 11/06/2024  8:00 PM ED from 11/05/2024 in [facility] Most recent reading at 11/06/2024 12:44 AM ED from 11/05/2024 in [facility] Emergency Department at [facility] Most recent reading at 11/05/2024  3:29 PM  C-SSRS RISK CATEGORY No Risk Low Risk No Risk     Musculoskeletal  Assessment complaint while patient was resting in bed, per nursing staff patient is not visible on the milieu throughout the day  Psychiatric Specialty Exam  Presentation  General Appearance:  Casual  Eye Contact: Minimal  Speech: Clear and Coherent  Speech Volume: Normal  Handedness: Right   Mood and Affect  Mood: Depressed; Anxious  Affect: Congruent   Thought Process  Thought Processes: Coherent  Descriptions of Associations:Intact  Orientation:Full (Time, Place and Person)  Thought Content:Logical  Diagnosis of Schizophrenia or Schizoaffective disorder in past: Yes  Duration of Psychotic Symptoms: Greater than six months   Hallucinations:Hallucinations: Auditory Description of Auditory Hallucinations: Hearing voices  Ideas of Reference:None  Suicidal Thoughts:Suicidal Thoughts: No  Homicidal Thoughts:Homicidal Thoughts: No   Sensorium  Memory: Immediate Fair; Remote Fair  Judgment: Fair  Insight: Fair   Art Therapist  Concentration: Fair  Attention Span: Fair  Recall: Fair  Fund of Knowledge: Fair  Language: Fair   Psychomotor Activity  Psychomotor Activity: Psychomotor Activity: -- (patient was resting in bed)  Assets  Assets: Desire for Improvement   Sleep  Sleep: Sleep: Fair  Estimated Sleeping Duration (Last 24 Hours): 14.00-17.00 hours  Nutritional  Assessment (For OBS and FBC admissions only) Has the patient had a weight loss or gain of 10 pounds or more in the last 3 months?: No Has the patient had a decrease in food intake/or appetite?: No Does the patient have dental problems?: No Does the patient have eating habits or behaviors that may be indicators of an eating disorder including binging or inducing vomiting?: No Has the patient recently lost weight without trying?: 0 Has the patient been eating poorly because of a decreased appetite?: 0 Malnutrition Screening Tool Score: 0    Physical Exam  Physical Exam Vitals and nursing note reviewed.  Constitutional:      Appearance: Normal appearance.  Cardiovascular:     Rate and Rhythm: Bradycardia present.  Skin:    General: Skin is warm.  Neurological:     Mental Status: She is alert and oriented to person, place, and time.    ROS Blood pressure 116/73, pulse 78, temperature 97.9 F (36.6 C), temperature source Oral, resp. rate 17, SpO2 100%. There is no height or weight on file to calculate BMI.  Treatment Plan Summary: Daily contact with patient to assess and evaluate symptoms and progress in treatment  Continue with current treatment plan on 11/07/2024  Substance-induced mood disorder Schizophrenia: Posttraumatic stress disorder: Major depressive disorder:  Continue Invega  3 mg p.o. nightly Continue clonidine  taper/COWs Continue trazodone  50 mg daily Continue hydroxyzine  25 mg p.o. as needed PRNs for detox/withdrawal symptoms  -UDS positive amphetamines,Secobarbital , cocaine and morphine   Hypotensive: 98/53 HR 73 -increase hydration, initiated Ensure to 37 mL twice daily, hold clonidine .  Patient encouraged to participate in therapeutic milieu - CSW to continue with discharge disposition   Staci LOISE Kerns, NP 11/07/2024 11:27 AM

## 2024-11-07 NOTE — ED Notes (Signed)
 Patient mainly isolative to room, but did come to dayroom for snacks. Observed appropriate interactions with peers and staff, however limited and pt does not initiate. C/O generalized body aches- provided PRN robaxin  and naproxen . Monitor for safety.

## 2024-11-07 NOTE — ED Notes (Signed)
 Patient resting with equal and unlabored respirations. No appeared or reported distress. Monitor for safety.

## 2024-11-07 NOTE — ED Notes (Signed)
 Pt denies SI/HI/AVH. Generalized pain managed with prn naproxen . Compliant with scheduled meds; prn atarax , robaxin , and trazodone  given. She was cooperative, depressed, and preoccupied. No behavioral concerns at this time. Pt sleeping in bed, no acute distress noted. Respirations even and unlabored. Continue to monitor for safety.

## 2024-11-07 NOTE — ED Notes (Signed)
 Pt sleeping in bed, no acute distress noted. Respirations even and unlabored. Continue to monitor for safety.

## 2024-11-07 NOTE — ED Notes (Signed)
 Patient A&Ox4. Passive SI endorses AH.  No acute distress noted. Support and encouragement provided. Routine safety checks conducted according to facility protocol. Encouraged patient to notify staff if thoughts of harm toward self or others arise. Patient verbalize understanding and agreement. Will continue to monitor for safety.

## 2024-11-07 NOTE — Group Note (Signed)
 Group Topic: Understanding Self  Group Date: 11/07/2024 Start Time: 1200 End Time: 1230 Facilitators: Leron Charolette CROME, RN  Department: [facility]  Number of Participants: 6  Group Focus: goals/reality orientation Treatment Modality:  Leisure Development Interventions utilized were problem solving Purpose: express feelings  Name: Carol Flowers Date of Birth: September 16, 2001  MR: [id number]    Level of Participation:  Level of Participation: moderate Quality of Participation: cooperative Interactions with others: gave feedback Mood/Affect: appropriate Triggers (if applicable): NA  Cognition: coherent/clear Progress: Moderate Response: Progressive  Plan: patient will be encouraged to participate in Group    Patients Problems:  Patient Active Problem List   Diagnosis Date Noted   Polysubstance abuse (HCC) 11/06/2024   Schizophrenia (HCC) 02/12/2020

## 2024-11-08 LAB — LIPID PANEL
Cholesterol: 112 mg/dL (ref 0–200)
HDL: 39 mg/dL — ABNORMAL LOW
LDL Cholesterol: 57 mg/dL (ref 0–99)
Total CHOL/HDL Ratio: 2.8 ratio
Triglycerides: 80 mg/dL
VLDL: 16 mg/dL (ref 0–40)

## 2024-11-08 LAB — TSH: TSH: 1.02 u[IU]/mL (ref 0.350–4.500)

## 2024-11-08 LAB — HEMOGLOBIN A1C
Hgb A1c MFr Bld: 5.6 % (ref 4.8–5.6)
Mean Plasma Glucose: 114.02 mg/dL

## 2024-11-08 LAB — VITAMIN D 25 HYDROXY (VIT D DEFICIENCY, FRACTURES): Vit D, 25-Hydroxy: 15.1 ng/mL — ABNORMAL LOW (ref 30–100)

## 2024-11-08 LAB — VITAMIN B12: Vitamin B-12: 290 pg/mL (ref 180–914)

## 2024-11-08 MED ORDER — VITAMIN D 25 MCG (1000 UNIT) PO TABS
2000.0000 [IU] | ORAL_TABLET | Freq: Every day | ORAL | Status: DC
  Administered 2024-11-08 – 2024-11-09 (×2): 2000 [IU] via ORAL
  Filled 2024-11-08 (×2): qty 2

## 2024-11-08 NOTE — ED Notes (Signed)
 Patient appears resting with equal and unlabored respirations. No appeared or reported distress. Monitor for safety.
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
## 2024-11-08 NOTE — Group Note (Signed)
 Group Topic: Fears and Unhealthy Coping Skills  Group Date: 11/08/2024 Start Time: 1200 End Time: 1230 Facilitators: Reinhold Minor BROCKS, RN  Department: [facility]  Number of Participants: 9  Group Focus: coping skills Treatment Modality:  Psychoeducation Interventions utilized were patient education Purpose: enhance coping skills  Name: Carol Flowers Date of Birth: July 22, 2001  MR: [id number]     Level of Participation: active Quality of Participation: attentive and cooperative Interactions with others: gave feedback Mood/Affect: appropriate Triggers (if applicable):  Cognition: coherent/clear Progress: Gaining insight Response: will utilize coping strategies more often Plan: patient will be encouraged to utilize coping skills  Patients Problems:  Patient Active Problem List   Diagnosis Date Noted   Polysubstance abuse (HCC) 11/06/2024   Schizophrenia (HCC) 02/12/2020

## 2024-11-09 MED ORDER — GABAPENTIN 100 MG PO CAPS: 200.0000 mg | ORAL_CAPSULE | Freq: Three times a day (TID) | ORAL | 0 refills | Status: AC

## 2024-11-09 MED ORDER — PALIPERIDONE ER 3 MG PO TB24: 3.0000 mg | ORAL_TABLET | Freq: Two times a day (BID) | ORAL | 0 refills | Status: AC

## 2024-11-09 MED ORDER — VITAMIN D3 25 MCG PO TABS: 2000.0000 [IU] | ORAL_TABLET | Freq: Every day | ORAL | 0 refills | Status: AC

## 2024-11-09 NOTE — Discharge Instructions (Addendum)
 Patient is instructed prior to discharge to:  Take all medications as prescribed by his/her mental healthcare provider. Report any adverse effects and or reactions from the medicines to his/her outpatient provider promptly. Keep all scheduled appointments, to ensure that you are getting refills on time and to avoid any interruption in your medication.  If you are unable to keep an appointment call to reschedule.  Be sure to follow-up with resources and follow-up appointments provided.  Patient has been instructed & cautioned: To not engage in alcohol and or illegal drug use while on prescription medicines. In the event of worsening symptoms, patient is instructed to call the crisis hotline, 911 and or go to the nearest ED for appropriate evaluation and treatment of symptoms. To follow-up with his/her primary care provider for your other medical issues, concerns and or health care needs.  Information: -National Suicide Prevention Lifeline 1-800-SUICIDE or (972) 627-2482.  -988 offers 24/7 access to trained crisis counselors who can help people experiencing mental health-related distress. People can call or text 988 or chat 988lifeline.org for themselves or if they are worried about a loved one who may need crisis support.    SHELTERS: [facility] [address] [place], [place], [place] [address] [phone]  [facility] [address] [place], [place], [place] [address] [phone]  [facility] [address] [place], [place] [address] [phone]  [facility] [address] [place], [place] [address] [phone]  [facility] [address] [place], [place] [address] [phone]  Residential Treatment (referrals have been sent to both agencies)  [facility] [address] [place], [place] [address]  [facility] [address] [place],  [place] [address] [phone]  ADDITIONAL RESOURCES FOR OUTPATIENT AND INPATIENT  [facility] Crisis Hotline, available 24 hours a day, 7 days a week: [phone] [facility] Accepts Medicaid, [place], private insurance [phone] [address] [place], [place]   [facility]  Offers substance abuse intensive outpatient program (several hours a day, several days a week), partial hospitalization program [phone] [address] [place], [place] [address]  [phone] [address] [place], [place] [address]  [phone] [address] [place], [place] [address]  [phone] [address] [place] [address] [place], [place] [address]   [facility] Offers individual, family and group counseling 3 locations - [place], [place], and [place]  [phone]  [address] [place], [place] [address]  [address] [place], [place] [address]  [address] [place], [place] [address]   [employer] Individual counseling [employer], private insurance [phone] [address] [place], [place] [address]

## 2024-11-09 NOTE — ED Notes (Signed)
 Patient denies pain and is resting comfortably.

## 2024-11-09 NOTE — Care Management (Signed)
 [facility] Care Management...  Client is requesting to be discharged home to:   [address] [place], [place] [address]  Client has resources listed in AVS for medication management, outpatient therapy, and residential treatment.  Clinical Research Associate completed taxi voucher.   RN will arrange transportation.

## 2024-11-09 NOTE — Group Note (Signed)
 Group Topic: Wellness  Group Date: 11/09/2024 Start Time: 1200 End Time: 1230 Facilitators: Daved Tinnie HERO, RN  Department: [facility]  Number of Participants: 8  Group Focus: psychiatric education Treatment Modality:  Psychoeducation Interventions utilized were patient education Purpose: increase insight  Name: KAELEEN ODOM Date of Birth: 2001-09-20  MR: [id number]    Level of Participation: moderate Quality of Participation: attentive Interactions with others: gave feedback Mood/Affect: appropriate Triggers (if applicable): n/a Cognition: coherent/clear Progress: Gaining insight Response: medications reviewed, pt expressed understanding  Plan: patient will be encouraged to attend future RN education groups  Patients Problems:  Patient Active Problem List   Diagnosis Date Noted   Polysubstance abuse (HCC) 11/06/2024   Schizophrenia (HCC) 02/12/2020

## 2024-11-09 NOTE — ED Notes (Signed)
 Pt presents as very flat, occasionally having a blank stare when speaking with her. Pt reports to feel 'terrible' r/t withdrawal. Pt administered prn robaxin  and advil . Scheduled medications reviewed, questions denied. Pt provided with shower supplies as requested.

## 2024-11-09 NOTE — ED Notes (Signed)
 Pt sleeping in bed. Pt ate lunch. Pt requested to be discharged from facility, provider and discharge planners were notified.

## 2024-11-10 NOTE — Group Note (Signed)
 Group Topic: Relapse and Recovery  Group Date: 11/09/2024 Start Time: 1100 End Time: 1150 Facilitators: Gerome Jolly, NT  Department: [facility]  Number of Participants: 6  Group Focus: acceptance, chemical dependency education, goals/reality orientation, healthy friendships, relapse prevention, and substance abuse education Treatment Modality:  Cognitive Behavioral Therapy and Psychoeducation Interventions utilized were confrontation, exploration, story telling, and support Purpose: Facilitator provided patients with literature from the NA Just For Today Meditation for February 3rd reading We need each other. This was used to focus on the similarities of each of us  and not the differences, explore maladaptive thinking, express irrational fears, and relapse prevention strategies. Facilitator ended the meeting with NA reading We do Recover  Name: Carol Flowers Date of Birth: August 26, 2001  MR: [id number]    Level of Participation: Pt did not attend group Quality of Participation: na Interactions with others: na Mood/Affect: na   Triggers (if applicable): na Cognition: na Progress: na Response: Pt did not attend group  Plan: follow-up needed  Patients Problems:  Patient Active Problem List   Diagnosis Date Noted   Polysubstance abuse (HCC) 11/06/2024   Schizophrenia (HCC) 02/12/2020
